# Patient Record
Sex: Female | Born: 1980 | Race: White | Hispanic: No | Marital: Married | Smoking: Never smoker
Health system: Southern US, Community
[De-identification: ages and names within clinical notes are randomized; demographics above are authoritative.]

## PROBLEM LIST (undated history)

## (undated) DIAGNOSIS — F419 Anxiety disorder, unspecified: Secondary | ICD-10-CM

## (undated) DIAGNOSIS — Z85828 Personal history of other malignant neoplasm of skin: Secondary | ICD-10-CM

## (undated) DIAGNOSIS — K141 Geographic tongue: Secondary | ICD-10-CM

## (undated) DIAGNOSIS — F5104 Psychophysiologic insomnia: Secondary | ICD-10-CM

## (undated) DIAGNOSIS — B070 Plantar wart: Secondary | ICD-10-CM

## (undated) DIAGNOSIS — K589 Irritable bowel syndrome without diarrhea: Secondary | ICD-10-CM

## (undated) DIAGNOSIS — Z87442 Personal history of urinary calculi: Secondary | ICD-10-CM

## (undated) DIAGNOSIS — J45909 Unspecified asthma, uncomplicated: Secondary | ICD-10-CM

## (undated) HISTORY — DX: Anxiety disorder, unspecified: F41.9

## (undated) HISTORY — PX: BASAL CELL CARCINOMA EXCISION: SHX1214

## (undated) HISTORY — DX: Psychophysiologic insomnia: F51.04

## (undated) HISTORY — DX: Plantar wart: B07.0

## (undated) HISTORY — DX: Personal history of other malignant neoplasm of skin: Z85.828

## (undated) HISTORY — PX: NO PAST SURGERIES: SHX2092

## (undated) HISTORY — DX: Unspecified asthma, uncomplicated: J45.909

## (undated) HISTORY — DX: Irritable bowel syndrome, unspecified: K58.9

## (undated) HISTORY — DX: Personal history of urinary calculi: Z87.442

## (undated) HISTORY — DX: Geographic tongue: K14.1

## (undated) HISTORY — PX: WISDOM TOOTH EXTRACTION: SHX21

---

## 2011-05-16 ENCOUNTER — Emergency Department
Admission: EM | Admit: 2011-05-16 | Discharge: 2011-05-16 | Disposition: A | Discharge status: Home / Self Care | Payer: BC Managed Care – PPO | Source: Home / Self Care | Attending: Emergency Medicine | Admitting: Emergency Medicine

## 2011-05-16 DIAGNOSIS — J029 Acute pharyngitis, unspecified: Secondary | ICD-10-CM

## 2011-05-16 DIAGNOSIS — J069 Acute upper respiratory infection, unspecified: Secondary | ICD-10-CM

## 2011-05-16 NOTE — ED Provider Notes (Signed)
History     CSN: [id number]  Arrival date & time 05/16/11  1503   First MD Initiated Contact with Patient 05/16/11 1503      No chief complaint on file.   (Consider location/radiation/quality/duration/timing/severity/associated sxs/prior treatment) HPI Dana Robertson is a 31 y.o. female who complains of onset of cold symptoms for 3 days.  She & her husband are trying to get pregnant, so she'd prefer not to take any meds if possible.  + sore throat No cough No pleuritic pain No wheezing + nasal congestion + post-nasal drainage No sinus pain/pressure No chest congestion No itchy/red eyes No earache No hemoptysis No SOB + chills/sweats No fever No nausea No vomiting No abdominal pain No diarrhea No skin rashes + fatigue ? myalgias + headache    No past medical history on file.  No past surgical history on file.  No family history on file.  History  Substance Use Topics  . Smoking status: Not on file  . Smokeless tobacco: Not on file  . Alcohol Use: Not on file    OB History    No data available      Review of Systems  All other systems reviewed and are negative.    Allergies  Review of patient's allergies indicates not on file.  Home Medications  No current outpatient prescriptions on file.  There were no vitals taken for this visit.  Physical Exam  Nursing note and vitals reviewed. Constitutional: She is oriented to person, place, and time. She appears well-developed and well-nourished.  HENT:  Head: Normocephalic and atraumatic.  Right Ear: Tympanic membrane, external ear and ear canal normal.  Left Ear: Tympanic membrane, external ear and ear canal normal.  Nose: Rhinorrhea present.  Mouth/Throat: No oropharyngeal exudate, posterior oropharyngeal edema or tonsillar abscesses.  Eyes: No scleral icterus.  Neck: Neck supple.  Cardiovascular: Regular rhythm and normal heart sounds.   Pulmonary/Chest: Effort normal and breath sounds normal. No  respiratory distress.  Neurological: She is alert and oriented to person, place, and time.  Skin: Skin is warm and dry.  Psychiatric: She has a normal mood and affect. Her speech is normal.    ED Course  Procedures (including critical care time)  Labs Reviewed - No data to display No results found.   No diagnosis found.    MDM  1)  rapid strep is negative. A culture is pending. Because she is currently pregnant, I would prefer that she treated with hydration, nasal saline in no medicines if possible. 2)  Use nasal saline solution (over the counter) at least 3 times a day. 3)  Can take tylenol for pain or fever. 4)  Follow up with your primary doctor if no improvement in 5-7 days, sooner if increasing pain, fever, or new symptoms.     Lily Kocher, MD 05/16/11 [phone]

## 2011-05-16 NOTE — ED Notes (Signed)
Pt c/o sore throat and cold SX onset Thursday.

## 2011-05-18 ENCOUNTER — Telehealth: Payer: Self-pay | Admitting: *Deleted

## 2014-01-07 ENCOUNTER — Emergency Department
Admission: EM | Admit: 2014-01-07 | Discharge: 2014-01-07 | Disposition: A | Discharge status: Home / Self Care | Payer: BC Managed Care – PPO | Source: Home / Self Care | Attending: Emergency Medicine | Admitting: Emergency Medicine

## 2014-01-07 ENCOUNTER — Encounter: Payer: Self-pay | Admitting: Emergency Medicine

## 2014-01-07 DIAGNOSIS — J0100 Acute maxillary sinusitis, unspecified: Secondary | ICD-10-CM

## 2014-01-07 MED ORDER — AMOXICILLIN 875 MG PO TABS: ORAL_TABLET | ORAL | Status: DC

## 2014-01-07 MED ORDER — FLUTICASONE PROPIONATE 50 MCG/ACT NA SUSP: NASAL | Status: DC

## 2014-01-07 NOTE — ED Notes (Signed)
Sx began approx 1 wk ago.  Nasal congestion with green mucus, non productive cough.  Used Vicks Sinus Spray

## 2014-01-07 NOTE — ED Provider Notes (Signed)
CSN: [id number]     Arrival date & time 01/07/14  0920 History   First MD Initiated Contact with Patient 01/07/14 0932     Chief Complaint  Patient presents with  . URI   (Consider location/radiation/quality/duration/timing/severity/associated sxs/prior Treatment) HPI SINUSITIS  Onset: 7 days Facial/sinus pressure with discolored nasal mucus.    Severity: moderate Tried OTC meds without significant relief.  Symptoms:  + Fever  + URI prodrome with nasal congestion + Minimal swollen neck glands + mild Sinus Headache + mild ear pressure  No Allergy symptoms No significant Sore Throat No eye symptoms     No significant Cough No chest pain No shortness of breath  No wheezing  No Abdominal Pain No Nausea No Vomiting No diarrhea  No Myalgias No focal neurologic symptoms No syncope No Rash  No Urinary symptoms          History reviewed. No pertinent past medical history. History reviewed. No pertinent past surgical history. No family history on file. History  Substance Use Topics  . Smoking status: Never Smoker   . Smokeless tobacco: Never Used  . Alcohol Use: Yes   OB History   Grav Para Term Preterm Abortions TAB SAB Ect Mult Living                 Review of Systems  All other systems reviewed and are negative.   Allergies  Codeine  Home Medications   Prior to Admission medications   Medication Sig Start Date End Date Taking? Authorizing Provider  loratadine-pseudoephedrine (CLARITIN-D 24-HOUR) 10-240 MG per 24 hr tablet Take 1 tablet by mouth daily.   Yes Historical Provider, MD  Probiotic Product (PROBIOTIC & ACIDOPHILUS EX ST PO) Take by mouth.   Yes Historical Provider, MD  zolpidem (AMBIEN) 5 MG tablet Take 5 mg by mouth at bedtime as needed for sleep.   Yes Historical Provider, MD  amoxicillin (AMOXIL) 875 MG tablet Take 1 twice a day X 10 days. 01/07/14   Lajean Manesavid Massey, MD  fluticasone Aleda Grana(FLONASE) 50 MCG/ACT nasal spray 1 or 2 sprays each  nostril twice a day 01/07/14   Lajean Manesavid Massey, MD  Multiple Vitamins-Minerals (MULTIVITAMIN PO) Take by mouth.    Historical Provider, MD   BP 109/81  Pulse 81  Temp(Src) 97.9 F (36.6 C) (Oral)  Ht 5' 3.75" (1.619 m)  Wt 118 lb 4 oz (53.638 kg)  BMI 20.46 kg/m2  SpO2 100%  LMP 12/25/2013 Physical Exam  Nursing note and vitals reviewed. Constitutional: She is oriented to person, place, and time. She appears well-developed and well-nourished. No distress.  HENT:  Head: Normocephalic and atraumatic.  Right Ear: Tympanic membrane, external ear and ear canal normal.  Left Ear: Tympanic membrane, external ear and ear canal normal.  Nose: Mucosal edema and rhinorrhea present. Right sinus exhibits maxillary sinus tenderness. Left sinus exhibits maxillary sinus tenderness.  Mouth/Throat: Oropharynx is clear and moist. No oral lesions. No oropharyngeal exudate.  Eyes: Right eye exhibits no discharge. Left eye exhibits no discharge. No scleral icterus.  Neck: Neck supple.  Cardiovascular: Normal rate, regular rhythm and normal heart sounds.   Pulmonary/Chest: Effort normal and breath sounds normal. She has no wheezes. She has no rales.  Lymphadenopathy:    She has no cervical adenopathy.  Neurological: She is alert and oriented to person, place, and time.  Skin: Skin is warm and dry.    ED Course  Procedures (including critical care time) Labs Review Labs Reviewed - No data to  display  Imaging Review No results found.   MDM   1. Acute maxillary sinusitis, recurrence not specified    Treatment options discussed, as well as risks, benefits, alternatives. Patient voiced understanding and agreement with the following plans:   Amoxicillin Flonase Wean off nasal decongestant spray Claritin or Zyrtec, but precautions  to avoid if this over drying  Mucinex D Follow-up with your primary care doctor in 5-7 days if not improving, or sooner if symptoms become worse. Precautions  discussed. Red flags discussed. Questions invited and answered. Patient voiced understanding and agreement.   Lajean Manes, MD 01/07/14 1322

## 2017-06-14 DIAGNOSIS — Z01419 Encounter for gynecological examination (general) (routine) without abnormal findings: Secondary | ICD-10-CM | POA: Diagnosis not present

## 2017-06-14 DIAGNOSIS — Z6820 Body mass index (BMI) 20.0-20.9, adult: Secondary | ICD-10-CM | POA: Diagnosis not present

## 2017-06-29 DIAGNOSIS — J329 Chronic sinusitis, unspecified: Secondary | ICD-10-CM | POA: Diagnosis not present

## 2017-06-29 DIAGNOSIS — J301 Allergic rhinitis due to pollen: Secondary | ICD-10-CM | POA: Diagnosis not present

## 2017-06-29 DIAGNOSIS — B9689 Other specified bacterial agents as the cause of diseases classified elsewhere: Secondary | ICD-10-CM | POA: Diagnosis not present

## 2017-06-29 DIAGNOSIS — R21 Rash and other nonspecific skin eruption: Secondary | ICD-10-CM | POA: Diagnosis not present

## 2017-07-29 DIAGNOSIS — N200 Calculus of kidney: Secondary | ICD-10-CM | POA: Diagnosis not present

## 2017-10-26 DIAGNOSIS — F5101 Primary insomnia: Secondary | ICD-10-CM | POA: Diagnosis not present

## 2018-02-01 DIAGNOSIS — L731 Pseudofolliculitis barbae: Secondary | ICD-10-CM | POA: Diagnosis not present

## 2018-02-01 DIAGNOSIS — D485 Neoplasm of uncertain behavior of skin: Secondary | ICD-10-CM | POA: Diagnosis not present

## 2018-02-01 DIAGNOSIS — C44612 Basal cell carcinoma of skin of right upper limb, including shoulder: Secondary | ICD-10-CM | POA: Diagnosis not present

## 2018-04-26 DIAGNOSIS — Z0000 Encounter for general adult medical examination without abnormal findings: Secondary | ICD-10-CM | POA: Diagnosis not present

## 2018-04-26 DIAGNOSIS — Z13220 Encounter for screening for lipoid disorders: Secondary | ICD-10-CM | POA: Diagnosis not present

## 2018-05-16 ENCOUNTER — Ambulatory Visit: Payer: Self-pay | Admitting: Physician Assistant

## 2018-05-16 ENCOUNTER — Encounter: Payer: Self-pay | Admitting: Physician Assistant

## 2018-05-16 VITALS — BP 113/82 | HR 86 | Ht 64.0 in | Wt 116.0 lb

## 2018-05-16 DIAGNOSIS — Z87442 Personal history of urinary calculi: Secondary | ICD-10-CM | POA: Diagnosis not present

## 2018-05-16 DIAGNOSIS — F5104 Psychophysiologic insomnia: Secondary | ICD-10-CM

## 2018-05-16 DIAGNOSIS — Z7689 Persons encountering health services in other specified circumstances: Secondary | ICD-10-CM

## 2018-05-16 DIAGNOSIS — G43009 Migraine without aura, not intractable, without status migrainosus: Secondary | ICD-10-CM

## 2018-05-16 DIAGNOSIS — K141 Geographic tongue: Secondary | ICD-10-CM | POA: Insufficient documentation

## 2018-05-16 DIAGNOSIS — K581 Irritable bowel syndrome with constipation: Secondary | ICD-10-CM | POA: Diagnosis not present

## 2018-05-16 DIAGNOSIS — Z85828 Personal history of other malignant neoplasm of skin: Secondary | ICD-10-CM

## 2018-05-16 MED ORDER — SUMATRIPTAN SUCCINATE 50 MG PO TABS: 50.0000 mg | ORAL_TABLET | Freq: Once | ORAL | 2 refills | Status: DC | PRN

## 2018-05-16 MED ORDER — ZOLPIDEM TARTRATE 10 MG PO TABS: 5.0000 mg | ORAL_TABLET | Freq: Every evening | ORAL | 2 refills | Status: DC | PRN

## 2018-05-16 NOTE — Progress Notes (Signed)
HPI:                                                                Dana Robertson is a 38 y.o. female who presents to [facility]: Primary Care Sports Medicine today to establish care  Current concerns:  IBS: struggles mostly with constipation and bloating. Takes a capful of Miralax every morning. She exercises regularly. She takes Smooth Move tea approx 1 per week and Mag Citrate every other month. She was evaluated by [facility]. No prior hx of colonoscopy.  Migraines: diagnosed in high school. Reports 1 headache every 2-3 months. Triggers include weather/barometric pressure and menstrual cycle. Typically takes Tylenol and 12-hr Sudafed, which usually works for her. She has tried Imitrex in the past and would like an Rx for this.  Chronic insomnia: has been taking Ambien nightly for the last 6 years. She cannot sleep without it.  Prior medications: Trazodone (ineffective), Lunesta (expensive)  Depression screen [facility] 2/9 05/16/2018  Decreased Interest 0  Down, Depressed, Hopeless 0  PHQ - 2 Score 0    No flowsheet data found.    Past Medical History:  Diagnosis Date  . Anxiety   . Asthma    childhood  . Chronic insomnia   . Geographic tongue   . History of nephrolithiasis   . History of skin cancer   . IBS (irritable bowel syndrome)    Past Surgical History:  Procedure Laterality Date  . BASAL CELL CARCINOMA EXCISION Right    arm  . NO PAST SURGERIES    . WISDOM TOOTH EXTRACTION     Social History   Tobacco Use  . Smoking status: Never Smoker  . Smokeless tobacco: Never Used  Substance Use Topics  . Alcohol use: Yes    Alcohol/week: 1.0 - 2.0 standard drinks    Types: 1 - 2 Standard drinks or equivalent per week   family history includes Breast cancer in her maternal grandmother; Diabetes in her father; Skin cancer in her father.    ROS: Review of Systems  Gastrointestinal: Positive for constipation.  Neurological: Positive for  headaches.  Psychiatric/Behavioral: The patient has insomnia.   All other systems reviewed and are negative.    Medications: Current Outpatient Medications  Medication Sig Dispense Refill  . fluticasone (FLONASE) 50 MCG/ACT nasal spray 1 or 2 sprays each nostril twice a day 16 g 0  . imiquimod (ALDARA) 5 % cream Apply topically 3 (three) times a week.    . loratadine-pseudoephedrine (CLARITIN-D 24-HOUR) 10-240 MG per 24 hr tablet Take 1 tablet by mouth daily.    . Multiple Vitamins-Minerals (MULTIVITAMIN PO) Take by mouth.    . Polyethylene Glycol 3350 (MIRALAX PO) Take by mouth.    . Probiotic Product (PROBIOTIC & ACIDOPHILUS EX ST PO) Take by mouth.    Marland Kitchen Specialty Vitamins Products (VITAMINS FOR HAIR PO) Take by mouth.    . zolpidem (AMBIEN) 10 MG tablet Take 0.5-1 tablets (5-10 mg total) by mouth at bedtime as needed for sleep. 30 tablet 2  . SUMAtriptan (IMITREX) 50 MG tablet Take 1 tablet (50 mg total) by mouth once as needed for migraine. May repeat in 2 hours if headache persists or recurs. 6 tablet 2   No current facility-administered medications  for this visit.    Allergies  Allergen Reactions  . Codeine Nausea Only       Objective:  BP 113/82   Pulse 86   Ht 5\' 4"  (1.626 m)   Wt 116 lb (52.6 kg)   LMP 04/29/2018   BMI 19.91 kg/m  Gen:  alert, not ill-appearing, no distress, appropriate for age HEENT: head normocephalic without obvious abnormality, conjunctiva and cornea clear, wearing glasses, trachea midline Pulm: Normal work of breathing, normal phonation Neuro: alert and oriented x 3, no tremor MSK: extremities atraumatic, normal gait and station Skin: intact, no rashes on exposed skin, no jaundice, no cyanosis Psych: well-groomed, cooperative, good eye contact, euthymic mood, affect mood-congruent, speech is articulate, and thought processes clear and goal-directed   Assessment and Plan: 38 y.o. female with   .Dana Robertson was seen today for establish  care.  Diagnoses and all orders for this visit:  Encounter to establish care  History of nephrolithiasis  Chronic insomnia -     zolpidem (AMBIEN) 10 MG tablet; Take 0.5-1 tablets (5-10 mg total) by mouth at bedtime as needed for sleep.  History of skin cancer Comments: [facility]  Irritable bowel syndrome with constipation  Migraine without aura and without status migrainosus, not intractable -     SUMAtriptan (IMITREX) 50 MG tablet; Take 1 tablet (50 mg total) by mouth once as needed for migraine. May repeat in 2 hours if headache persists or recurs.   - Personally reviewed PMH, PSH, PFH, medications, allergies, HM - Personally reviewed labs in Care Everywhere from 04/2018 showing normal CMP and CBC - Age-appropriate cancer screening: Pap UTD, requesting record from [facility] - Influenza UTD per patient - Tdap UTD per patient - PHQ2 negative    Patient education and anticipatory guidance given Patient agrees with treatment plan Follow-up in 6 months for CPE or sooner as needed if symptoms worsen or fail to improve  Levonne Hubert PA-C

## 2018-05-25 DIAGNOSIS — C44612 Basal cell carcinoma of skin of right upper limb, including shoulder: Secondary | ICD-10-CM | POA: Diagnosis not present

## 2018-06-17 DIAGNOSIS — S161XXA Strain of muscle, fascia and tendon at neck level, initial encounter: Secondary | ICD-10-CM | POA: Diagnosis not present

## 2018-06-17 DIAGNOSIS — M542 Cervicalgia: Secondary | ICD-10-CM | POA: Diagnosis not present

## 2018-06-19 ENCOUNTER — Telehealth: Payer: Self-pay | Admitting: Family Medicine

## 2018-06-19 MED ORDER — METAXALONE 400 MG PO TABS: 400.0000 mg | ORAL_TABLET | Freq: Three times a day (TID) | ORAL | 0 refills | Status: DC

## 2018-06-19 NOTE — Telephone Encounter (Signed)
Patiet called this evening. Went to [employer] this week for neck injury. Fell from swing. Reports xrays were negative. Taking Motrin and icing. Still having significant pain.  Will call I muscle relaxer. Will f/u with PCP at end of week if not improving. Work on gentle stretches.

## 2018-06-27 ENCOUNTER — Ambulatory Visit: Payer: BLUE CROSS/BLUE SHIELD | Admitting: Physician Assistant

## 2018-09-30 ENCOUNTER — Telehealth: Payer: Self-pay | Admitting: Family Medicine

## 2018-09-30 DIAGNOSIS — G43009 Migraine without aura, not intractable, without status migrainosus: Secondary | ICD-10-CM

## 2018-09-30 MED ORDER — SUMATRIPTAN SUCCINATE 50 MG PO TABS: 50.0000 mg | ORAL_TABLET | Freq: Every day | ORAL | 2 refills | Status: DC | PRN

## 2018-09-30 NOTE — Telephone Encounter (Signed)
Pt called and needs refill in imitrex. Leaving town [employer].  Had more migrdaines this month and used last one.  Will [facility] ot [facility].

## 2018-10-20 ENCOUNTER — Encounter: Payer: Self-pay | Admitting: Physician Assistant

## 2018-10-20 ENCOUNTER — Telehealth (INDEPENDENT_AMBULATORY_CARE_PROVIDER_SITE_OTHER): Payer: BC Managed Care – PPO | Admitting: Physician Assistant

## 2018-10-20 DIAGNOSIS — R0982 Postnasal drip: Secondary | ICD-10-CM

## 2018-10-20 DIAGNOSIS — G43009 Migraine without aura, not intractable, without status migrainosus: Secondary | ICD-10-CM | POA: Diagnosis not present

## 2018-10-20 DIAGNOSIS — J309 Allergic rhinitis, unspecified: Secondary | ICD-10-CM | POA: Insufficient documentation

## 2018-10-20 DIAGNOSIS — Z79899 Other long term (current) drug therapy: Secondary | ICD-10-CM | POA: Diagnosis not present

## 2018-10-20 DIAGNOSIS — F411 Generalized anxiety disorder: Secondary | ICD-10-CM | POA: Insufficient documentation

## 2018-10-20 DIAGNOSIS — F5104 Psychophysiologic insomnia: Secondary | ICD-10-CM | POA: Diagnosis not present

## 2018-10-20 MED ORDER — ZOLPIDEM TARTRATE 10 MG PO TABS: 5.0000 mg | ORAL_TABLET | Freq: Every evening | ORAL | 5 refills | Status: DC | PRN

## 2018-10-20 NOTE — Progress Notes (Signed)
Virtual Visit via Video Note  I connected with Dana Robertson on 10/20/18 at 10:10 AM EDT by a video enabled telemedicine application and verified that I am speaking with the correct person using two identifiers.   I discussed the limitations of evaluation and management by telemedicine and the availability of in person appointments. The patient expressed understanding and agreed to proceed.  History of Present Illness: HPI:                                                                Dana Robertson is a 38 y.o. female   CC: medication management  Chronic insomnia: associated with anxiety. Reports that the week before her period she typically has more sleep difficulty and will take 10 mg of Ambien. Most other nights she takes 5 mg. She states anxiety has been increased and she attributes this to change in her regular daily routine. She feels she is supported and is adapting to the changes the pandemic has caused.  Menses: has noticed menses have been more irregular the last few months, coming several days early or late. Periods overall are lighter in the last 3-4 years. Denies menorrhagia or dysmenorrhea.  Migraines: has noticed increased headache frequency over the last few months (1-2 headache days per month), which she attributes to stress and change in routine. Headaches still respond to Imitrex.   Allergic rhinitis: states she has had increased pnd, mucus and throat clearing past her usual spring allergy season. She attributed this to using herbal supplement Ashweganda. She is taking Claritin-D without much relief. Not taking her Flonase just to limit number of medications she is taking.   IBS: well managed with diet, Miralax, probiotic and Mag Citrate prn. She has reduced lactose/dairy, gluten and processed sugar.   [facility] 10/20/2018 05/16/2018  Decreased Interest 0 0  Down, Depressed, Hopeless 1 0  PHQ - 2 Score 1 0  Altered sleeping 2 -  Tired, decreased  energy 0 -  Change in appetite 0 -  Feeling bad or failure about yourself  0 -  Trouble concentrating 0 -  Moving slowly or fidgety/restless 0 -  Suicidal thoughts 0 -  PHQ-9 Score 3 -    GAD 7 : Generalized Anxiety Score 10/20/2018  Nervous, Anxious, on Edge 1  Control/stop worrying 0  Worry too much - different things 1  Trouble relaxing 1  Restless 0  Easily annoyed or irritable 1  Afraid - awful might happen 0  Total GAD 7 Score 4      Past Medical History:  Diagnosis Date  . Anxiety   . Asthma    childhood  . Chronic insomnia   . Geographic tongue   . History of nephrolithiasis   . History of skin cancer   . IBS (irritable bowel syndrome)    Past Surgical History:  Procedure Laterality Date  . BASAL CELL CARCINOMA EXCISION Right    arm  . NO PAST SURGERIES    . WISDOM TOOTH EXTRACTION     Social History   Tobacco Use  . Smoking status: Never Smoker  . Smokeless tobacco: Never Used  Substance Use Topics  . Alcohol use: Yes    Alcohol/week: 1.0 - 2.0 standard drinks    Types: 1 -  2 Standard drinks or equivalent per week   family history includes Breast cancer in her maternal grandmother; Diabetes in her father; Skin cancer in her father.    ROS: negative except as noted in the HPI  Medications: Current Outpatient Medications  Medication Sig Dispense Refill  . imiquimod (ALDARA) 5 % cream Apply topically 3 (three) times a week.    . loratadine-pseudoephedrine (CLARITIN-D 24-HOUR) 10-240 MG per 24 hr tablet Take 1 tablet by mouth daily.    . Multiple Vitamins-Minerals (MULTIVITAMIN PO) Take by mouth.    . Polyethylene Glycol 3350 (MIRALAX PO) Take by mouth.    . Probiotic Product (PROBIOTIC & ACIDOPHILUS EX ST PO) Take by mouth.    Marland Kitchen. Specialty Vitamins Products (VITAMINS FOR HAIR PO) Take by mouth.    . SUMAtriptan (IMITREX) 50 MG tablet Take 1 tablet (50 mg total) by mouth daily as needed for migraine. May repeat in 2 hours if headache persists or  recurs. 10 tablet 2  . zolpidem (AMBIEN) 10 MG tablet Take 0.5-1 tablets (5-10 mg total) by mouth at bedtime as needed for sleep. 60 tablet 5   No current facility-administered medications for this visit.    Allergies  Allergen Reactions  . Codeine Nausea Only       Objective:  LMP 10/16/2018 (Exact Date)   Wt Readings from Last 3 Encounters:  05/16/18 116 lb (52.6 kg)  01/07/14 118 lb 4 oz (53.6 kg)  05/16/11 115 lb (52.2 kg)   Temp Readings from Last 3 Encounters:  01/07/14 97.9 F (36.6 C) (Oral)  05/16/11 99 F (37.2 C) (Oral)   BP Readings from Last 3 Encounters:  05/16/18 113/82  01/07/14 109/81  05/16/11 114/79   Pulse Readings from Last 3 Encounters:  05/16/18 86  01/07/14 81  05/16/11 85    Gen:  alert, not ill-appearing, no distress, appropriate for age HEENT: head normocephalic without obvious abnormality, conjunctiva and cornea clear, trachea midline Pulm: Normal work of breathing, normal phonation Neuro: alert and oriented x 3 Psych: cooperative, euthymic mood, affect mood-congruent, speech is articulate, normal rate and volume; thought processes clear and goal-directed, normal judgment, good insight  No results found for: CREATININE, BUN, NA, K, CL, CO2 No results found for: ALT, AST, GGT, ALKPHOS, BILITOT   Assessment and Plan: 38 y.o. female with   .Diagnoses and all orders for this visit:  Encounter for long-term (current) use of medications  Chronic insomnia -     zolpidem (AMBIEN) 10 MG tablet; Take 0.5-1 tablets (5-10 mg total) by mouth at bedtime as needed for sleep.  Migraine without aura and without status migrainosus, not intractable  Generalized anxiety disorder  Allergic rhinitis with postnasal drip  GAD, Chronic insomnia PHQ9=3, GAD7=4 Declines medication [facility]. Managing symptoms on her own with exercise and healthy coping skills Ambien refilled. She is dependent on this medication for sleep. Does  not wish to taper at this time  Migraines Cont prn Sumtriptan 50 mg  Allergic rhinitis Recommended trial of different antihistamine, nasal saline irrigation or re-start Flonase  Follow-up in 6 months or sooner as needed if symptoms worsen or fail to improve  Follow Up Instructions:    I discussed the assessment and treatment plan with the patient. The patient was provided an opportunity to ask questions and all were answered. The patient agreed with the plan and demonstrated an understanding of the instructions.   The patient was advised to call back or seek an in-person  evaluation if the symptoms worsen or if the condition fails to improve as anticipated.  I provided 10 minutes of non-face-to-face time during this encounter.   Trixie Dredge, [place]

## 2018-10-30 ENCOUNTER — Telehealth: Payer: BLUE CROSS/BLUE SHIELD | Admitting: Physician Assistant

## 2018-11-14 ENCOUNTER — Ambulatory Visit: Payer: Self-pay | Admitting: Physician Assistant

## 2018-11-28 ENCOUNTER — Telehealth: Payer: Self-pay | Admitting: Family Medicine

## 2018-11-28 MED ORDER — HYDROCODONE-ACETAMINOPHEN 5-325 MG PO TABS: 1.0000 | ORAL_TABLET | Freq: Four times a day (QID) | ORAL | 0 refills | Status: DC | PRN

## 2018-11-28 MED ORDER — TAMSULOSIN HCL 0.4 MG PO CAPS: 0.4000 mg | ORAL_CAPSULE | Freq: Every day | ORAL | 0 refills | Status: DC

## 2018-11-28 NOTE — Telephone Encounter (Signed)
Pt called after hours. Reports history of kidney stones.  Started having back pain in spasms this afternoon with rust colored urine around 4:30. Has tried IBU 800mg  for pain relief.  Still having pain.  Significant nausea.  No fever. Some chills from pain. No dysuria.  12 tab hydrocodone sent to pharmacy and flomax. Call PCP in next day or two if not improving or passing stone.

## 2018-11-29 DIAGNOSIS — Z87442 Personal history of urinary calculi: Secondary | ICD-10-CM | POA: Diagnosis not present

## 2018-11-29 DIAGNOSIS — R1030 Lower abdominal pain, unspecified: Secondary | ICD-10-CM | POA: Diagnosis not present

## 2018-11-29 DIAGNOSIS — N2882 Megaloureter: Secondary | ICD-10-CM | POA: Diagnosis not present

## 2018-11-29 DIAGNOSIS — N132 Hydronephrosis with renal and ureteral calculous obstruction: Secondary | ICD-10-CM | POA: Diagnosis not present

## 2018-11-29 DIAGNOSIS — R1032 Left lower quadrant pain: Secondary | ICD-10-CM | POA: Diagnosis not present

## 2018-11-30 DIAGNOSIS — N202 Calculus of kidney with calculus of ureter: Secondary | ICD-10-CM | POA: Diagnosis not present

## 2018-11-30 DIAGNOSIS — K5900 Constipation, unspecified: Secondary | ICD-10-CM | POA: Diagnosis not present

## 2018-11-30 DIAGNOSIS — N201 Calculus of ureter: Secondary | ICD-10-CM | POA: Diagnosis not present

## 2018-12-01 DIAGNOSIS — N201 Calculus of ureter: Secondary | ICD-10-CM | POA: Diagnosis not present

## 2018-12-05 ENCOUNTER — Other Ambulatory Visit: Payer: Self-pay | Admitting: Family Medicine

## 2018-12-20 ENCOUNTER — Other Ambulatory Visit: Payer: Self-pay | Admitting: Physician Assistant

## 2019-01-02 DIAGNOSIS — F4310 Post-traumatic stress disorder, unspecified: Secondary | ICD-10-CM | POA: Diagnosis not present

## 2019-01-18 DIAGNOSIS — F4310 Post-traumatic stress disorder, unspecified: Secondary | ICD-10-CM | POA: Diagnosis not present

## 2019-01-29 DIAGNOSIS — F4310 Post-traumatic stress disorder, unspecified: Secondary | ICD-10-CM | POA: Diagnosis not present

## 2019-02-15 DIAGNOSIS — F4310 Post-traumatic stress disorder, unspecified: Secondary | ICD-10-CM | POA: Diagnosis not present

## 2019-03-12 DIAGNOSIS — F4312 Post-traumatic stress disorder, chronic: Secondary | ICD-10-CM | POA: Diagnosis not present

## 2019-04-12 DIAGNOSIS — F4312 Post-traumatic stress disorder, chronic: Secondary | ICD-10-CM | POA: Diagnosis not present

## 2019-05-02 ENCOUNTER — Other Ambulatory Visit: Payer: Self-pay | Admitting: Family Medicine

## 2019-05-02 DIAGNOSIS — G43009 Migraine without aura, not intractable, without status migrainosus: Secondary | ICD-10-CM

## 2019-05-10 DIAGNOSIS — F3341 Major depressive disorder, recurrent, in partial remission: Secondary | ICD-10-CM | POA: Diagnosis not present

## 2019-06-07 ENCOUNTER — Other Ambulatory Visit: Payer: Self-pay | Admitting: Physician Assistant

## 2019-06-07 DIAGNOSIS — F5104 Psychophysiologic insomnia: Secondary | ICD-10-CM

## 2019-06-08 ENCOUNTER — Ambulatory Visit: Payer: BC Managed Care – PPO | Admitting: Medical-Surgical

## 2019-06-11 ENCOUNTER — Other Ambulatory Visit: Payer: Self-pay

## 2019-06-11 ENCOUNTER — Encounter: Payer: Self-pay | Admitting: Medical-Surgical

## 2019-06-11 ENCOUNTER — Ambulatory Visit (INDEPENDENT_AMBULATORY_CARE_PROVIDER_SITE_OTHER): Payer: BC Managed Care – PPO | Admitting: Medical-Surgical

## 2019-06-11 VITALS — BP 118/78 | HR 91 | Temp 98.1°F | Ht 64.0 in | Wt 122.0 lb

## 2019-06-11 DIAGNOSIS — G43009 Migraine without aura, not intractable, without status migrainosus: Secondary | ICD-10-CM | POA: Diagnosis not present

## 2019-06-11 DIAGNOSIS — F5104 Psychophysiologic insomnia: Secondary | ICD-10-CM | POA: Diagnosis not present

## 2019-06-11 DIAGNOSIS — K581 Irritable bowel syndrome with constipation: Secondary | ICD-10-CM

## 2019-06-11 MED ORDER — LINACLOTIDE 290 MCG PO CAPS: 290.0000 ug | ORAL_CAPSULE | Freq: Every day | ORAL | 3 refills | Status: DC

## 2019-06-11 MED ORDER — SIMETHICONE 180 MG PO CAPS: 1.0000 | ORAL_CAPSULE | Freq: Four times a day (QID) | ORAL | 0 refills | Status: DC | PRN

## 2019-06-11 MED ORDER — SUMATRIPTAN SUCCINATE 50 MG PO TABS: 50.0000 mg | ORAL_TABLET | Freq: Every day | ORAL | 2 refills | Status: DC | PRN

## 2019-06-11 MED ORDER — ZOLPIDEM TARTRATE 10 MG PO TABS: 5.0000 mg | ORAL_TABLET | Freq: Every evening | ORAL | 3 refills | Status: DC | PRN

## 2019-06-11 NOTE — Assessment & Plan Note (Signed)
Discussed options for management of symptoms.  Will trial Linzess 290 mcg daily along with simethicone 4 times daily as needed.  If this is unsuccessful, may need to revisit GI evaluation.

## 2019-06-11 NOTE — Assessment & Plan Note (Signed)
Continue Ambien 1/2-1 tab nightly as needed for sleep.

## 2019-06-11 NOTE — Patient Instructions (Signed)
Low-FODMAP Eating Plan  FODMAPs (fermentable oligosaccharides, disaccharides, monosaccharides, and polyols) are sugars that are hard for some people to digest. A low-FODMAP eating plan may help some people who have bowel (intestinal) diseases to manage their symptoms. This meal plan can be complicated to follow. Work with a diet and nutrition specialist (dietitian) to make a low-FODMAP eating plan that is right for you. A dietitian can make sure that you get enough nutrition from this diet. What are tips for following this plan? Reading food labels  Check labels for hidden FODMAPs such as: ? High-fructose syrup. ? Honey. ? Agave. ? Natural fruit flavors. ? Onion or garlic powder.  Choose low-FODMAP foods that contain 3-4 grams of fiber per serving.  Check food labels for serving sizes. Eat only one serving at a time to make sure FODMAP levels stay low. Meal planning  Follow a low-FODMAP eating plan for up to 6 weeks, or as told by your health care provider or dietitian.  To follow the eating plan: 1. Eliminate high-FODMAP foods from your diet completely. 2. Gradually reintroduce high-FODMAP foods into your diet one at a time. Most people should wait a few days after introducing one high-FODMAP food before they introduce the next high-FODMAP food. Your dietitian can recommend how quickly you may reintroduce foods. 3. Keep a daily record of what you eat and drink, and make note of any symptoms that you have after eating. 4. Review your daily record with a dietitian regularly. Your dietitian can help you identify which foods you can eat and which foods you should avoid. General tips  Drink enough fluid each day to keep your urine pale yellow.  Avoid processed foods. These often have added sugar and may be high in FODMAPs.  Avoid most dairy products, whole grains, and sweeteners.  Work with a dietitian to make sure you get enough fiber in your diet. Recommended  foods Grains  Gluten-free grains, such as rice, oats, buckwheat, quinoa, corn, polenta, and millet. Gluten-free pasta, bread, or cereal. Rice noodles. Corn tortillas. Vegetables  Eggplant, zucchini, cucumber, peppers, green beans, Brussels sprouts, bean sprouts, lettuce, arugula, kale, Swiss chard, spinach, collard greens, bok choy, summer squash, potato, and tomato. Limited amounts of corn, carrot, and sweet potato. Green parts of scallions. Fruits  Bananas, oranges, lemons, limes, blueberries, raspberries, strawberries, grapes, cantaloupe, honeydew melon, kiwi, papaya, passion fruit, and pineapple. Limited amounts of dried cranberries, banana chips, and shredded coconut. Dairy  Lactose-free milk, yogurt, and kefir. Lactose-free cottage cheese and ice cream. Non-dairy milks, such as almond, coconut, hemp, and rice milk. Yogurts made of non-dairy milks. Limited amounts of goat cheese, brie, mozzarella, parmesan, swiss, and other hard cheeses. Meats and other protein foods  Unseasoned beef, pork, poultry, or fish. Eggs. Bacon. Tofu (firm) and tempeh. Limited amounts of nuts and seeds, such as almonds, walnuts, brazil nuts, pecans, peanuts, pumpkin seeds, chia seeds, and sunflower seeds. Fats and oils  Butter-free spreads. Vegetable oils, such as olive, canola, and sunflower oil. Seasoning and other foods  Artificial sweeteners with names that do not end in "ol" such as aspartame, saccharine, and stevia. Maple syrup, white table sugar, raw sugar, brown sugar, and molasses. Fresh basil, coriander, parsley, rosemary, and thyme. Beverages  Water and mineral water. Sugar-sweetened soft drinks. Small amounts of orange juice or cranberry juice. Black and green tea. Most dry wines. Coffee. This may not be a complete list of low-FODMAP foods. Talk with your dietitian for more information. Foods to avoid Grains  Wheat,   including kamut, durum, and semolina. Barley and bulgur. Couscous. Wheat-based  cereals. Wheat noodles, bread, crackers, and pastries. Vegetables  Chicory root, artichoke, asparagus, cabbage, snow peas, sugar snap peas, mushrooms, and cauliflower. Onions, garlic, leeks, and the white part of scallions. Fruits  Fresh, dried, and juiced forms of apple, pear, watermelon, peach, plum, cherries, apricots, blackberries, boysenberries, figs, nectarines, and mango. Avocado. Dairy  Milk, yogurt, ice cream, and soft cheese. Cream and sour cream. Milk-based sauces. Custard. Meats and other protein foods  Fried or fatty meat. Sausage. Cashews and pistachios. Soybeans, baked beans, black beans, chickpeas, kidney beans, fava beans, navy beans, lentils, and split peas. Seasoning and other foods  Any sugar-free gum or candy. Foods that contain artificial sweeteners such as sorbitol, mannitol, isomalt, or xylitol. Foods that contain honey, high-fructose corn syrup, or agave. Bouillon, vegetable stock, beef stock, and chicken stock. Garlic and onion powder. Condiments made with onion, such as hummus, chutney, pickles, relish, salad dressing, and salsa. Tomato paste. Beverages  Chicory-based drinks. Coffee substitutes. Chamomile tea. Fennel tea. Sweet or fortified wines such as port or sherry. Diet soft drinks made with isomalt, mannitol, maltitol, sorbitol, or xylitol. Apple, pear, and mango juice. Juices with high-fructose corn syrup. This may not be a complete list of high-FODMAP foods. Talk with your dietitian to discuss what dietary choices are best for you.  Summary  A low-FODMAP eating plan is a short-term diet that eliminates FODMAPs from your diet to help ease symptoms of certain bowel diseases.  The eating plan usually lasts up to 6 weeks. After that, high-FODMAP foods are restarted gradually, one at a time, so you can find out which may be causing symptoms.  A low-FODMAP eating plan can be complicated. It is best to work with a dietitian who has experience with this type of  plan. This information is not intended to replace advice given to you by your health care provider. Make sure you discuss any questions you have with your health care provider. Document Revised: 03/04/2017 Document Reviewed: 11/16/2016 Elsevier Patient Education  2020 [employer] Inc[employer]  

## 2019-06-11 NOTE — Progress Notes (Signed)
Subjective:    CC: Insomnia, GI concerns, migraines  HPI: Pleasant 39 year old female presenting today to discuss insomnia, migraines, and GI concerns.  Insomnia-long prior history of insomnia with difficulty falling asleep but able to stay asleep without problems.  Taking Ambien one half tab approximately 2-1/2 to 3 weeks out of every month to help with sleep.  Feels this is very effective and would like to continue.  Migraines-approximately 1-2 headache days per month on average with increased to 3-4 in the spring and fall.  Migraines vary depending upon the weather and hormonal factors.  Imitrex is very helpful with usually only 1 dose needed to resolve migraine symptoms.  IBS with constipation-taking MiraLAX 1 capful daily.  Also using mag citrate every 2 to 3 months.  She has taken many over-the-counter fiber supplements, increased fluids, and increased exercise.  Was evaluated by GI 4 years ago but has not seen them since.  She has been tested twice with no food allergies identified however she did do an [place] well test that said she has mild to moderate sensitivity to dairy foods.  Biggest concern is continuous bloating and gassiness that causes significant discomfort.  Bowel movements are daily and soft.  Using simethicone twice daily as needed without much relief.  I reviewed the past medical history, family history, social history, surgical history, and allergies today and no changes were needed.  Please see the problem list section below in epic for further details.  Past Medical History: Past Medical History:  Diagnosis Date  . Anxiety   . Asthma    childhood  . Chronic insomnia   . Geographic tongue   . History of nephrolithiasis   . History of skin cancer   . IBS (irritable bowel syndrome)    Past Surgical History: Past Surgical History:  Procedure Laterality Date  . BASAL CELL CARCINOMA EXCISION Right    arm  . NO PAST SURGERIES    . WISDOM TOOTH EXTRACTION      Social History: Social History   Socioeconomic History  . Marital status: Married    Spouse name: Not on file  . Number of children: Not on file  . Years of education: Not on file  . Highest education level: Not on file  Occupational History  . Not on file  Tobacco Use  . Smoking status: Never Smoker  . Smokeless tobacco: Never Used  Substance and Sexual Activity  . Alcohol use: Yes    Alcohol/week: 1.0 - 2.0 standard drinks    Types: 1 - 2 Standard drinks or equivalent per week  . Drug use: Not Currently    Types: Marijuana  . Sexual activity: Yes    Birth control/protection: Other-see comments    Comment: vasectomy  Other Topics Concern  . Not on file  Social History Narrative  . Not on file   Social Determinants of Health   Financial Resource Strain:   . Difficulty of Paying Living Expenses: Not on file  Food Insecurity:   . Worried About Charity fundraiser in the Last Year: Not on file  . Ran Out of Food in the Last Year: Not on file  Transportation Needs:   . Lack of Transportation (Medical): Not on file  . Lack of Transportation (Non-Medical): Not on file  Physical Activity:   . Days of Exercise per Week: Not on file  . Minutes of Exercise per Session: Not on file  Stress:   . Feeling of Stress : Not on file  Social Connections:   . Frequency of Communication with Friends and Family: Not on file  . Frequency of Social Gatherings with Friends and Family: Not on file  . Attends Religious Services: Not on file  . Active Member of Clubs or Organizations: Not on file  . Attends Banker Meetings: Not on file  . Marital Status: Not on file   Family History: Family History  Problem Relation Age of Onset  . Diabetes Father   . Skin cancer Father   . Breast cancer Maternal Grandmother    Allergies: Allergies  Allergen Reactions  . Codeine Nausea Only   Medications: See med rec.  Review of Systems: No fevers, chills, night sweats, weight  loss, chest pain, or shortness of breath.   Objective:    General: Well Developed, well nourished, and in no acute distress.  Neuro: Alert and oriented x3.  HEENT: Normocephalic, atraumatic.  Skin: Warm and dry. Cardiac: Regular rate and rhythm, no murmurs rubs or gallops, no lower extremity edema.  Respiratory: Clear to auscultation bilaterally. Not using accessory muscles, speaking in full sentences.   Impression and Recommendations:    Migraine headache without aura Continue sumatriptan as needed.  Refills provided.  Irritable bowel syndrome with constipation Discussed options for management of symptoms.  Will trial Linzess 290 mcg daily along with simethicone 4 times daily as needed.  If this is unsuccessful, may need to revisit GI evaluation.  Chronic insomnia Continue Ambien 1/2-1 tab nightly as needed for sleep.  Return in about 6 months (around 12/12/2019) for annual physical exam with labs.  ___________________________________________ Thayer Ohm, DNP, APRN, FNP-BC Primary Care and Sports Medicine [facility] [place]

## 2019-06-11 NOTE — Assessment & Plan Note (Signed)
Continue sumatriptan as needed.  Refills provided.

## 2019-06-15 ENCOUNTER — Telehealth: Payer: Self-pay | Admitting: Physician Assistant

## 2019-06-15 NOTE — Telephone Encounter (Signed)
Received PA on Linzess sent through cover my meds waiting on determination. - CF

## 2019-06-18 MED ORDER — TRULANCE 3 MG PO TABS: 3.0000 mg | ORAL_TABLET | Freq: Every day | ORAL | 2 refills | Status: DC

## 2019-06-18 NOTE — Addendum Note (Signed)
Addended byChristen Butter on: 06/18/2019 03:10 PM   Modules accepted: Orders

## 2019-06-18 NOTE — Telephone Encounter (Signed)
Received fax from [facility] they denied coverage on Linzess due to patient must try and fail [employer] first. Placing in providers box for review. - CF

## 2019-07-02 ENCOUNTER — Telehealth: Payer: Self-pay | Admitting: Medical-Surgical

## 2019-07-02 NOTE — Telephone Encounter (Signed)
Approved today ([employer]) Effective from 07/02/2019 through 06/30/2020. Pharmacy aware.

## 2019-11-09 ENCOUNTER — Ambulatory Visit (INDEPENDENT_AMBULATORY_CARE_PROVIDER_SITE_OTHER): Payer: 59 | Admitting: Family Medicine

## 2019-11-09 ENCOUNTER — Other Ambulatory Visit: Payer: Self-pay | Admitting: Physician Assistant

## 2019-11-09 VITALS — BP 110/80 | HR 112 | Temp 98.0°F | Wt 122.0 lb

## 2019-11-09 DIAGNOSIS — R310 Gross hematuria: Secondary | ICD-10-CM

## 2019-11-09 DIAGNOSIS — N200 Calculus of kidney: Secondary | ICD-10-CM

## 2019-11-09 DIAGNOSIS — M545 Low back pain, unspecified: Secondary | ICD-10-CM

## 2019-11-09 DIAGNOSIS — G43009 Migraine without aura, not intractable, without status migrainosus: Secondary | ICD-10-CM

## 2019-11-09 LAB — POCT URINALYSIS DIP (CLINITEK)
Glucose, UA: NEGATIVE mg/dL
Leukocytes, UA: NEGATIVE
Nitrite, UA: NEGATIVE
POC PROTEIN,UA: 30 — AB
Spec Grav, UA: >=1.03 — AB (ref 1.010–1.025)
Urobilinogen, UA: 0.2 E.U./dL
pH, UA: 6 (ref 5.0–8.0)

## 2019-11-09 NOTE — Progress Notes (Signed)
Patient reports bladder pressure and low back pain.  Feels like it is likely a kidney stone stuck in the bladder.  She has had multiple kidney stones before.  For at least the last 3 days she has not felt well.  Tuesday in particular about 3 days ago, she had excruciating back spasms and a low-grade fever to 100.2 that night.  She has not had a fever since then.  She was concerned that she might have a UTI.  Dipstick negative for infection though she does have protein blood and ketones that she is also family showing up her menstrual cycle.  Culture sent.  If negative for infection and discomfort continues then recommend referral back to urology.  Nani Gasser, MD

## 2019-11-09 NOTE — Progress Notes (Signed)
Patient is here for kidney stone symptoms. Patient reports taking OTC tylenol and sudafed and Zofran  for symptom relief. With no recent use of craterization or antibiotics. Patient has not taken AZO. Patient reports mild low back pain.  U/A and urine culture order placed and pending.

## 2019-11-10 LAB — URINE CULTURE
MICRO NUMBER:: 10797085
SPECIMEN QUALITY:: ADEQUATE

## 2019-11-12 ENCOUNTER — Encounter: Payer: Self-pay | Admitting: Family Medicine

## 2019-11-13 NOTE — Telephone Encounter (Signed)
Appointment has been made for [place]. No further questions at this time.

## 2019-11-14 ENCOUNTER — Ambulatory Visit (INDEPENDENT_AMBULATORY_CARE_PROVIDER_SITE_OTHER): Payer: Self-pay | Admitting: Medical-Surgical

## 2019-11-14 VITALS — BP 98/69 | HR 94

## 2019-11-14 DIAGNOSIS — N200 Calculus of kidney: Secondary | ICD-10-CM

## 2019-11-14 DIAGNOSIS — M545 Low back pain, unspecified: Secondary | ICD-10-CM

## 2019-11-14 NOTE — Progress Notes (Signed)
Pt here to give another urine sample since her last sample showed a growth of mixed flora that suggested possible contamination.   Sample obtained and immediately transferred into culture transfer tube.   Microscopic obtained also but not ordered if needed and placed in bag.

## 2019-11-16 LAB — URINE CULTURE REFLEX: Urine Culture, Comprehensive: NO GROWTH

## 2019-11-20 ENCOUNTER — Telehealth: Payer: Self-pay | Admitting: Family Medicine

## 2019-11-20 NOTE — Telephone Encounter (Signed)
Urine culture result not back.should have been on Friday.  Please call for results.

## 2019-11-21 NOTE — Telephone Encounter (Signed)
[facility] looking into this. Will let provider know update when available

## 2019-11-21 NOTE — Telephone Encounter (Signed)
Patient advised of negative results. States that for most part she is feeling fine but occasional dull but noticeable pain in lower right quadrant. She feels she is ok but this one is lasting longer than ones in the past.   States there is nothing she needs from [place] at this time, she will continue to drink water and if any new or worsening SX she will call us or urologist.

## 2019-11-30 ENCOUNTER — Other Ambulatory Visit (HOSPITAL_COMMUNITY)
Admission: RE | Admit: 2019-11-30 | Discharge: 2019-11-30 | Disposition: A | Discharge status: Home / Self Care | Payer: 59 | Source: Ambulatory Visit | Attending: Medical-Surgical | Admitting: Medical-Surgical

## 2019-11-30 ENCOUNTER — Ambulatory Visit (INDEPENDENT_AMBULATORY_CARE_PROVIDER_SITE_OTHER): Payer: 59 | Admitting: Medical-Surgical

## 2019-11-30 ENCOUNTER — Encounter: Payer: Self-pay | Admitting: Medical-Surgical

## 2019-11-30 ENCOUNTER — Other Ambulatory Visit: Payer: Self-pay

## 2019-11-30 VITALS — BP 111/81 | HR 86 | Temp 98.2°F | Ht 64.0 in | Wt 108.3 lb

## 2019-11-30 DIAGNOSIS — Z23 Encounter for immunization: Secondary | ICD-10-CM

## 2019-11-30 DIAGNOSIS — Z0000 Encounter for general adult medical examination without abnormal findings: Secondary | ICD-10-CM

## 2019-11-30 DIAGNOSIS — Z1159 Encounter for screening for other viral diseases: Secondary | ICD-10-CM

## 2019-11-30 DIAGNOSIS — Z124 Encounter for screening for malignant neoplasm of cervix: Secondary | ICD-10-CM | POA: Diagnosis present

## 2019-11-30 DIAGNOSIS — Z1231 Encounter for screening mammogram for malignant neoplasm of breast: Secondary | ICD-10-CM

## 2019-11-30 DIAGNOSIS — Z1329 Encounter for screening for other suspected endocrine disorder: Secondary | ICD-10-CM | POA: Diagnosis not present

## 2019-11-30 DIAGNOSIS — Z114 Encounter for screening for human immunodeficiency virus [HIV]: Secondary | ICD-10-CM

## 2019-11-30 NOTE — Progress Notes (Signed)
HPI: Dana Robertson is a 39 y.o. female who  has a past medical history of Anxiety, Asthma, Chronic insomnia, Geographic tongue, History of nephrolithiasis, History of skin cancer, IBS (irritable bowel syndrome), and Plantar wart.  she presents to [facility] today, 11/30/19,  for chief complaint of: Annual physical exam  Dentist: twice a year, COVID caused some delay, crown appointment upcoming Eye exam: done on Monday, glasses for driving/watching TV Exercise: was previously very active but not over the summer, walks dog a mile every day Diet: typically clean eater but not doing as well lately; not a big water drinker but trying to stay mindful; avoids soda; limit alcohol; poor appetite with stress  Significant family stress at home stemming from marital issues.  Recent stress resulting in weight loss from approximately 122 pounds to 108 pounds. Seeing a therapist.  Does not feel that she needs a medication to help with anxiety at this time.  Past medical, surgical, social and family history reviewed:  Patient Active Problem List   Diagnosis Date Noted  . Generalized anxiety disorder 10/20/2018  . Allergic rhinitis with postnasal drip 10/20/2018  . Migraine headache without aura 05/16/2018  . Irritable bowel syndrome with constipation 05/16/2018  . History of nephrolithiasis   . Geographic tongue   . Chronic insomnia   . History of skin cancer     Past Surgical History:  Procedure Laterality Date  . BASAL CELL CARCINOMA EXCISION Right    arm  . NO PAST SURGERIES    . WISDOM TOOTH EXTRACTION      Social History   Tobacco Use  . Smoking status: Never Smoker  . Smokeless tobacco: Never Used  Substance Use Topics  . Alcohol use: Yes    Comment: Occasionally    Family History  Problem Relation Age of Onset  . Diabetes Father   . Skin cancer Father   . Breast cancer Maternal Grandmother      Current medication list and  allergy/intolerance information reviewed:    Current Outpatient Medications  Medication Sig Dispense Refill  . loratadine-pseudoephedrine (CLARITIN-D 24-HOUR) 10-240 MG per 24 hr tablet Take 1 tablet by mouth daily.    . Multiple Vitamins-Minerals (MULTIVITAMIN PO) Take by mouth.    . Polyethylene Glycol 3350 (MIRALAX PO) Take by mouth daily.     . promethazine (PHENERGAN) 25 MG tablet Take by mouth as directed.    . Simethicone 180 MG CAPS Take 1 capsule (180 mg total) by mouth 4 (four) times daily as needed (gas/flatulance/bloating). 30 capsule 0  . Specialty Vitamins Products (VITAMINS FOR HAIR PO) Take by mouth.    . SUMAtriptan (IMITREX) 50 MG tablet TAKE 1 TABLET (50 MG TOTAL) BY MOUTH DAILY AS NEEDED FOR MIGRAINE. MAY REPEAT IN 2 HOURS IF HEADACHE PERSISTS OR RECURS. 10 tablet 2  . tamsulosin (FLOMAX) 0.4 MG CAPS capsule TAKE 1 CAPSULE BY MOUTH EVERY DAY (Patient taking differently: Take 0.4 mg by mouth daily as needed. ) 90 capsule 0  . zolpidem (AMBIEN) 10 MG tablet Take 0.5-1 tablets (5-10 mg total) by mouth at bedtime as needed for sleep. 60 tablet 3   No current facility-administered medications for this visit.    Allergies  Allergen Reactions  . Codeine Nausea Only      Review of Systems:  Constitutional:  No  fever, no chills, No recent illness, + unintentional weight changes. No significant fatigue.   HEENT: + cyclical headache, no vision change, + hearing change, No  sore throat, No  sinus pressure  Cardiac: No  chest pain, No  pressure, + palpitations, No  Orthopnea  Respiratory:  No shortness of breath. + Cough  Gastrointestinal: No  abdominal pain, No  nausea, No  vomiting,  No  blood in stool, No  diarrhea, + chronic constipation.  Musculoskeletal: No new myalgia/arthralgia  Skin: No  Rash, No other wounds/concerning lesions  Genitourinary: No  incontinence, No  abnormal genital bleeding, No abnormal genital discharge  Hem/Onc: No  easy bruising/bleeding,  No  abnormal lymph node  Endocrine: No cold intolerance,  No heat intolerance. No polyuria/polydipsia/polyphagia   Neurologic: No  weakness, No  dizziness, No  slurred speech/focal weakness/facial droop  Psychiatric: No  concerns with depression, + concerns with anxiety, No sleep problems, No mood problems  Exam:  BP 111/81   Pulse 86   Temp 98.2 F (36.8 C) (Oral)   Ht '5\' 4"'  (1.626 m)   Wt 108 lb 4.8 oz (49.1 kg)   LMP 11/30/2019   SpO2 99%   BMI 18.59 kg/m   Constitutional: VS see above. General Appearance: alert, well-developed, well-nourished, NAD  Eyes: Normal lids and conjunctive, non-icteric sclera  Ears, Nose, Mouth, Throat: MMM, Normal external inspection ears/nares/mouth/lips/gums. TM normal bilaterally. Pharynx/tonsils no erythema, no exudate. Nasal mucosa normal.   Neck: No masses, trachea midline. No thyroid enlargement. No tenderness/mass appreciated. No lymphadenopathy  Respiratory: Normal respiratory effort. no wheeze, no rhonchi, no rales  Cardiovascular: S1/S2 normal, no murmur, no rub/gallop auscultated. RRR. No lower extremity edema. Pedal pulse II/IV bilaterally DP and PT. No carotid bruit or JVD. No abdominal aortic bruit.  Gastrointestinal: Nontender, no masses. No hepatomegaly, no splenomegaly. No hernia appreciated. Bowel sounds normal. Rectal exam deferred.   Musculoskeletal: Gait normal. No clubbing/cyanosis of digits.   Neurological: Normal balance/coordination. No tremor. No cranial nerve deficit on limited exam. Motor and sensation intact and symmetric. Cerebellar reflexes intact.   Skin: warm, dry, intact. No rash/ulcer. No concerning nevi or subq nodules on limited exam.    Psychiatric: Normal judgment/insight. Normal mood and affect. Oriented x3.   Breasts: breasts appear normal, no suspicious masses, no skin or nipple changes or axillary nodes.  Pelvic exam: VULVA: normal appearing vulva with no masses, tenderness or lesions, VAGINA:  normal appearing vagina with normal color and discharge, no lesions, currently on menses, CERVIX: lesions absent, cervical discharge present - bloody, cervical motion tenderness absent, multiparous os, UTERUS: uterus is normal size, shape, consistency and nontender, mild uterine prolapse, PAP: Pap smear done today, HPV test, exam chaperoned by Glennie Hawk, MA.    No results found for this or any previous visit (from the past 72 hour(s)).  No results found.   ASSESSMENT/PLAN:   1. Need for Tdap vaccination Tdap administered in office by MA. - Tdap vaccine greater than or equal to 7yo IM  2. Need for influenza vaccination Flu vaccine administered in office by MA. - Flu Vaccine QUAD 36+ mos IM  3. Annual physical exam Checking CBC, CMP, and lipid panel today.  Pap smear with HPV cotesting completed. - Cytology - PAP - CBC - COMPLETE METABOLIC PANEL WITH GFR - Lipid panel  4. Screening for endocrine disorder Family history of thyroid disease and several family members.  Checking TSH today. - TSH  5. Screening for cervical cancer Pap smear completed with HPV cotesting today. - Cytology - PAP  6. Encounter for screening mammogram for malignant neoplasm of breast Mammogram ordered as she will be turning 39  next week. - MM 3D SCREEN BREAST BILATERAL; Future  7. Encounter for screening for HIV Discussed screening recommendations.  She is agreeable so we will add this to blood work today. - HIV Antibody (routine testing w rflx)  8. Encounter for hepatitis C screening test for low risk patient Discussed screening recommendations.  She is agreeable so we will also add this to blood work today. - Hepatitis C antibody   Orders Placed This Encounter  Procedures  . MM 3D SCREEN BREAST BILATERAL  . Tdap vaccine greater than or equal to 7yo IM  . Flu Vaccine QUAD 36+ mos IM  . HIV Antibody (routine testing w rflx)  . Hepatitis C antibody  . CBC  . COMPLETE METABOLIC PANEL  WITH GFR  . Lipid panel  . TSH    No orders of the defined types were placed in this encounter.   Patient Instructions  Preventive Care 30-69 Years Old, Female Preventive care refers to visits with your health care provider and lifestyle choices that can promote health and wellness. This includes:  A yearly physical exam. This may also be called an annual well check.  Regular dental visits and eye exams.  Immunizations.  Screening for certain conditions.  Healthy lifestyle choices, such as eating a healthy diet, getting regular exercise, not using drugs or products that contain nicotine and tobacco, and limiting alcohol use. What can I expect for my preventive care visit? Physical exam Your health care provider will check your:  Height and weight. This may be used to calculate body mass index (BMI), which tells if you are at a healthy weight.  Heart rate and blood pressure.  Skin for abnormal spots. Counseling Your health care provider may ask you questions about your:  Alcohol, tobacco, and drug use.  Emotional well-being.  Home and relationship well-being.  Sexual activity.  Eating habits.  Work and work Statistician.  Method of birth control.  Menstrual cycle.  Pregnancy history. What immunizations do I need?  Influenza (flu) vaccine  This is recommended every year. Tetanus, diphtheria, and pertussis (Tdap) vaccine  You may need a Td booster every 10 years. Varicella (chickenpox) vaccine  You may need this if you have not been vaccinated. Human papillomavirus (HPV) vaccine  If recommended by your health care provider, you may need three doses over 6 months. Measles, mumps, and rubella (MMR) vaccine  You may need at least one dose of MMR. You may also need a second dose. Meningococcal conjugate (MenACWY) vaccine  One dose is recommended if you are age 35-21 years and a first-year college student living in a residence hall, or if you have one of  several medical conditions. You may also need additional booster doses. Pneumococcal conjugate (PCV13) vaccine  You may need this if you have certain conditions and were not previously vaccinated. Pneumococcal polysaccharide (PPSV23) vaccine  You may need one or two doses if you smoke cigarettes or if you have certain conditions. Hepatitis A vaccine  You may need this if you have certain conditions or if you travel or work in places where you may be exposed to hepatitis A. Hepatitis B vaccine  You may need this if you have certain conditions or if you travel or work in places where you may be exposed to hepatitis B. Haemophilus influenzae type b (Hib) vaccine  You may need this if you have certain conditions. You may receive vaccines as individual doses or as more than one vaccine together in one shot (combination  vaccines). Talk with your health care provider about the risks and benefits of combination vaccines. What tests do I need?  Blood tests  Lipid and cholesterol levels. These may be checked every 5 years starting at age 81.  Hepatitis C test.  Hepatitis B test. Screening  Diabetes screening. This is done by checking your blood sugar (glucose) after you have not eaten for a while (fasting).  Sexually transmitted disease (STD) testing.  BRCA-related cancer screening. This may be done if you have a family history of breast, ovarian, tubal, or peritoneal cancers.  Pelvic exam and Pap test. This may be done every 3 years starting at age 76. Starting at age 40, this may be done every 5 years if you have a Pap test in combination with an HPV test. Talk with your health care provider about your test results, treatment options, and if necessary, the need for more tests. Follow these instructions at home: Eating and drinking   Eat a diet that includes fresh fruits and vegetables, whole grains, lean protein, and low-fat dairy.  Take vitamin and mineral supplements as  recommended by your health care provider.  Do not drink alcohol if: ? Your health care provider tells you not to drink. ? You are pregnant, may be pregnant, or are planning to become pregnant.  If you drink alcohol: ? Limit how much you have to 0-1 drink a day. ? Be aware of how much alcohol is in your drink. In the [place] one drink equals one 12 oz bottle of beer (355 mL), one 5 oz glass of wine (148 mL), or one 1 oz glass of hard liquor (44 mL). Lifestyle  Take daily care of your teeth and gums.  Stay active. Exercise for at least 30 minutes on 5 or more days each week.  Do not use any products that contain nicotine or tobacco, such as cigarettes, e-cigarettes, and chewing tobacco. If you need help quitting, ask your health care provider.  If you are sexually active, practice safe sex. Use a condom or other form of birth control (contraception) in order to prevent pregnancy and STIs (sexually transmitted infections). If you plan to become pregnant, see your health care provider for a preconception visit. What's next?  Visit your health care provider once a year for a well check visit.  Ask your health care provider how often you should have your eyes and teeth checked.  Stay up to date on all vaccines. This information is not intended to replace advice given to you by your health care provider. Make sure you discuss any questions you have with your health care provider. Document Revised: 12/01/2017 Document Reviewed: 12/01/2017 Elsevier Patient Education  [place].  Follow-up plan: Return in about 1 year (around 11/29/2020) for annual physical exam or sooner if needed.  Clearnce Sorrel, DNP, APRN, FNP-BC [facility]

## 2019-11-30 NOTE — Patient Instructions (Signed)
Preventive Care 21-39 Years Old, Female Preventive care refers to visits with your health care provider and lifestyle choices that can promote health and wellness. This includes:  A yearly physical exam. This may also be called an annual well check.  Regular dental visits and eye exams.  Immunizations.  Screening for certain conditions.  Healthy lifestyle choices, such as eating a healthy diet, getting regular exercise, not using drugs or products that contain nicotine and tobacco, and limiting alcohol use. What can I expect for my preventive care visit? Physical exam Your health care provider will check your:  Height and weight. This may be used to calculate body mass index (BMI), which tells if you are at a healthy weight.  Heart rate and blood pressure.  Skin for abnormal spots. Counseling Your health care provider may ask you questions about your:  Alcohol, tobacco, and drug use.  Emotional well-being.  Home and relationship well-being.  Sexual activity.  Eating habits.  Work and work environment.  Method of birth control.  Menstrual cycle.  Pregnancy history. What immunizations do I need?  Influenza (flu) vaccine  This is recommended every year. Tetanus, diphtheria, and pertussis (Tdap) vaccine  You may need a Td booster every 10 years. Varicella (chickenpox) vaccine  You may need this if you have not been vaccinated. Human papillomavirus (HPV) vaccine  If recommended by your health care provider, you may need three doses over 6 months. Measles, mumps, and rubella (MMR) vaccine  You may need at least one dose of MMR. You may also need a second dose. Meningococcal conjugate (MenACWY) vaccine  One dose is recommended if you are age 19-21 years and a first-year college student living in a residence hall, or if you have one of several medical conditions. You may also need additional booster doses. Pneumococcal conjugate (PCV13) vaccine  You may need  this if you have certain conditions and were not previously vaccinated. Pneumococcal polysaccharide (PPSV23) vaccine  You may need one or two doses if you smoke cigarettes or if you have certain conditions. Hepatitis A vaccine  You may need this if you have certain conditions or if you travel or work in places where you may be exposed to hepatitis A. Hepatitis B vaccine  You may need this if you have certain conditions or if you travel or work in places where you may be exposed to hepatitis B. Haemophilus influenzae type b (Hib) vaccine  You may need this if you have certain conditions. You may receive vaccines as individual doses or as more than one vaccine together in one shot (combination vaccines). Talk with your health care provider about the risks and benefits of combination vaccines. What tests do I need?  Blood tests  Lipid and cholesterol levels. These may be checked every 5 years starting at age 20.  Hepatitis C test.  Hepatitis B test. Screening  Diabetes screening. This is done by checking your blood sugar (glucose) after you have not eaten for a while (fasting).  Sexually transmitted disease (STD) testing.  BRCA-related cancer screening. This may be done if you have a family history of breast, ovarian, tubal, or peritoneal cancers.  Pelvic exam and Pap test. This may be done every 3 years starting at age 21. Starting at age 30, this may be done every 5 years if you have a Pap test in combination with an HPV test. Talk with your health care provider about your test results, treatment options, and if necessary, the need for more tests.   Follow these instructions at home: Eating and drinking   Eat a diet that includes fresh fruits and vegetables, whole grains, lean protein, and low-fat dairy.  Take vitamin and mineral supplements as recommended by your health care provider.  Do not drink alcohol if: ? Your health care provider tells you not to drink. ? You are  pregnant, may be pregnant, or are planning to become pregnant.  If you drink alcohol: ? Limit how much you have to 0-1 drink a day. ? Be aware of how much alcohol is in your drink. In the [place] one drink equals one 12 oz bottle of beer (355 mL), one 5 oz glass of wine (148 mL), or one 1 oz glass of hard liquor (44 mL). Lifestyle  Take daily care of your teeth and gums.  Stay active. Exercise for at least 30 minutes on 5 or more days each week.  Do not use any products that contain nicotine or tobacco, such as cigarettes, e-cigarettes, and chewing tobacco. If you need help quitting, ask your health care provider.  If you are sexually active, practice safe sex. Use a condom or other form of birth control (contraception) in order to prevent pregnancy and STIs (sexually transmitted infections). If you plan to become pregnant, see your health care provider for a preconception visit. What's next?  Visit your health care provider once a year for a well check visit.  Ask your health care provider how often you should have your eyes and teeth checked.  Stay up to date on all vaccines. This information is not intended to replace advice given to you by your health care provider. Make sure you discuss any questions you have with your health care provider. Document Revised: 12/01/2017 Document Reviewed: 12/01/2017 Elsevier Patient Education  2020 [employer].

## 2019-12-03 ENCOUNTER — Ambulatory Visit: Payer: 59

## 2019-12-03 ENCOUNTER — Other Ambulatory Visit: Payer: Self-pay

## 2019-12-03 LAB — CYTOLOGY - PAP
Comment: NEGATIVE
Diagnosis: NEGATIVE
High risk HPV: NEGATIVE

## 2019-12-06 ENCOUNTER — Ambulatory Visit (INDEPENDENT_AMBULATORY_CARE_PROVIDER_SITE_OTHER): Payer: 59

## 2019-12-06 ENCOUNTER — Other Ambulatory Visit: Payer: Self-pay

## 2019-12-06 DIAGNOSIS — Z1231 Encounter for screening mammogram for malignant neoplasm of breast: Secondary | ICD-10-CM | POA: Diagnosis not present

## 2019-12-11 LAB — COMPLETE METABOLIC PANEL WITH GFR
AG Ratio: 1.8 (calc) (ref 1.0–2.5)
ALT: 9 U/L (ref 6–29)
AST: 16 U/L (ref 10–30)
Albumin: 4.4 g/dL (ref 3.6–5.1)
Alkaline phosphatase (APISO): 73 U/L (ref 31–125)
BUN: 13 mg/dL (ref 7–25)
CO2: 29 mmol/L (ref 20–32)
Calcium: 9.6 mg/dL (ref 8.6–10.2)
Chloride: 102 mmol/L (ref 98–110)
Creat: 0.61 mg/dL (ref 0.50–1.10)
GFR, Est African American: 131 mL/min/{1.73_m2} (ref >=60)
GFR, Est Non African American: 113 mL/min/{1.73_m2} (ref >=60)
Globulin: 2.4 g/dL (calc) (ref 1.9–3.7)
Glucose, Bld: 94 mg/dL (ref 65–139)
Potassium: 5.2 mmol/L (ref 3.5–5.3)
Sodium: 137 mmol/L (ref 135–146)
Total Bilirubin: 0.4 mg/dL (ref 0.2–1.2)
Total Protein: 6.8 g/dL (ref 6.1–8.1)

## 2019-12-11 LAB — HEPATITIS C ANTIBODY
Hepatitis C Ab: NONREACTIVE
SIGNAL TO CUT-OFF: 0.01 (ref <1.00)

## 2019-12-11 LAB — LIPID PANEL
Cholesterol: 193 mg/dL (ref <200)
HDL: 80 mg/dL (ref >=50)
LDL Cholesterol (Calc): 94 mg/dL (calc)
Non-HDL Cholesterol (Calc): 113 mg/dL (calc) (ref <130)
Total CHOL/HDL Ratio: 2.4 (calc) (ref <5.0)
Triglycerides: 101 mg/dL (ref <150)

## 2019-12-11 LAB — CBC
HCT: 39.7 % (ref 35.0–45.0)
Hemoglobin: 13.2 g/dL (ref 11.7–15.5)
MCH: 30.3 pg (ref 27.0–33.0)
MCHC: 33.2 g/dL (ref 32.0–36.0)
MCV: 91.1 fL (ref 80.0–100.0)
MPV: 10.4 fL (ref 7.5–12.5)
Platelets: 325 10*3/uL (ref 140–400)
RBC: 4.36 10*6/uL (ref 3.80–5.10)
RDW: 13.2 % (ref 11.0–15.0)
WBC: 5 10*3/uL (ref 3.8–10.8)

## 2019-12-11 LAB — TSH: TSH: 2.47 mIU/L

## 2019-12-11 LAB — HIV ANTIBODY (ROUTINE TESTING W REFLEX): HIV 1&2 Ab, 4th Generation: NONREACTIVE

## 2019-12-12 ENCOUNTER — Encounter: Payer: Self-pay | Admitting: Medical-Surgical

## 2019-12-12 DIAGNOSIS — K581 Irritable bowel syndrome with constipation: Secondary | ICD-10-CM

## 2019-12-13 ENCOUNTER — Encounter: Payer: Self-pay | Admitting: Medical-Surgical

## 2019-12-13 ENCOUNTER — Telehealth (INDEPENDENT_AMBULATORY_CARE_PROVIDER_SITE_OTHER): Payer: 59 | Admitting: Medical-Surgical

## 2019-12-13 DIAGNOSIS — F411 Generalized anxiety disorder: Secondary | ICD-10-CM | POA: Diagnosis not present

## 2019-12-13 DIAGNOSIS — F5104 Psychophysiologic insomnia: Secondary | ICD-10-CM

## 2019-12-13 MED ORDER — HYDROXYZINE PAMOATE 25 MG PO CAPS: 25.0000 mg | ORAL_CAPSULE | Freq: Three times a day (TID) | ORAL | 0 refills | Status: DC | PRN

## 2019-12-13 MED ORDER — AMITRIPTYLINE HCL 25 MG PO TABS: 25.0000 mg | ORAL_TABLET | Freq: Every day | ORAL | 1 refills | Status: DC

## 2019-12-13 NOTE — Progress Notes (Signed)
Virtual Visit via Video Note  I connected with Dana Robertson on 12/13/19 at  2:40 PM EDT by a video enabled telemedicine application and verified that I am speaking with the correct person using two identifiers.   I discussed the limitations of evaluation and management by telemedicine and the availability of in person appointments. The patient expressed understanding and agreed to proceed.  Patient location: home Provider locations: office  Subjective:    CC: insomnia/anxiety concerns  HPI: Pleasant 39 year old female presenting via MyChart video visit to discuss insomnia and anxiety. She has had quite a few changes since our visit.  After our last appointment a couple of weeks ago, her husband went into inpatient rehab for 28 days for alcohol dependence.  She is caring for her small children at home and trying to maintain her responsibilities there.  She is also job hunting and going to interviews.  With her increase in stress in the recent life changes, she notes that she has had increasing difficulty sleeping at night.  She does have a chronic history of insomnia and has been taking Ambien 5 -10 mg nightly for sleep for about 6 years.  Over the past week, she notes that the Ambien has not been working as well and she struggles to get to sleep at night.  Once she is asleep, there is no difficulty staying asleep.  Unfortunately, when she lays down it gets quiet at night her mind begins to race thinking about all the things that happened that day and everything that will be happening the next day.  She has wanted to get off of Ambien for a while now as she is concerned about its connection with possible dementia/Alzheimer's and memory issues.  She has tried several things in the past including Lunesta, trazodone, melatonin, and over-the-counter remedies.  None of these medications were helpful for sleep.  Her ultimate goal is to be able to sleep well at night without being dependent on medication  she is currently doing therapy to help manage her anxiety and stress.  Denies SI/HI.  Past medical history, Surgical history, Family history not pertinant except as noted below, Social history, Allergies, and medications have been entered into the medical record, reviewed, and corrections made.   Review of Systems: See HPI for pertinent positives and negatives.   Objective:    General: Speaking clearly in complete sentences without any shortness of breath.  Alert and oriented x3.  Normal judgment. No apparent acute distress.  Impression and Recommendations:    1. Chronic insomnia/GAD Discussed various options for treating insomnia.  According to the most up-to-date information, benzodiazepines are no longer recommended for treating insomnia.  Discussed possibly adding hydroxyzine for anxiety to nightly Ambien or switching agents altogether.  After options presented, patient would like to try amitriptyline 25 mg nightly to help with sleep.  Advised patient to try this for at least 2 weeks to see if this is helpful.  Discussed possible side effects, especially with the first few doses.  At the 2-week point, if 25 mg nightly is not as helpful, she will reach out to me.  We may need to increase to 50 mg nightly at that point.  Also sending in hydroxyzine 25 mg 3 times daily as needed for anxiety.  Advised caution when taking this medicine with the amitriptyline due to the dual sedative effect.  This may be better utilized by taking about 1 hour before the amitriptyline or if she is still having difficulty sleeping an  hour after taking the amitriptyline.  Discussed psychological emergency resources.  No follow-ups on file.  20 minutes of non-face-to-face time was provided during this encounter.  I discussed the assessment and treatment plan with the patient. The patient was provided an opportunity to ask questions and all were answered. The patient agreed with the plan and demonstrated an understanding  of the instructions.   The patient was advised to call back or seek an in-person evaluation if the symptoms worsen or if the condition fails to improve as anticipated.  Thayer Ohm, DNP, APRN, FNP-BC  [facility]

## 2019-12-14 ENCOUNTER — Telehealth: Payer: 59 | Admitting: Medical-Surgical

## 2019-12-31 ENCOUNTER — Other Ambulatory Visit: Payer: Self-pay | Admitting: *Deleted

## 2019-12-31 ENCOUNTER — Other Ambulatory Visit: Payer: Self-pay | Admitting: Medical-Surgical

## 2019-12-31 DIAGNOSIS — Z0184 Encounter for antibody response examination: Secondary | ICD-10-CM

## 2020-01-24 ENCOUNTER — Other Ambulatory Visit: Payer: Self-pay | Admitting: Medical-Surgical

## 2020-01-24 MED ORDER — AMITRIPTYLINE HCL 25 MG PO TABS: 50.0000 mg | ORAL_TABLET | Freq: Every day | ORAL | 1 refills | Status: DC

## 2020-04-01 ENCOUNTER — Encounter: Payer: Self-pay | Admitting: Medical-Surgical

## 2020-04-01 DIAGNOSIS — K581 Irritable bowel syndrome with constipation: Secondary | ICD-10-CM

## 2020-04-02 ENCOUNTER — Other Ambulatory Visit: Payer: Self-pay | Admitting: Medical-Surgical

## 2020-04-02 DIAGNOSIS — K581 Irritable bowel syndrome with constipation: Secondary | ICD-10-CM

## 2020-04-02 MED ORDER — TRULANCE 3 MG PO TABS: 3.0000 mg | ORAL_TABLET | Freq: Every day | ORAL | 2 refills | Status: DC

## 2020-05-19 ENCOUNTER — Encounter: Payer: Self-pay | Admitting: Medical-Surgical

## 2020-05-19 DIAGNOSIS — G43009 Migraine without aura, not intractable, without status migrainosus: Secondary | ICD-10-CM

## 2020-05-20 MED ORDER — SUMATRIPTAN SUCCINATE 50 MG PO TABS: 50.0000 mg | ORAL_TABLET | Freq: Every day | ORAL | 2 refills | Status: DC | PRN

## 2020-05-23 ENCOUNTER — Other Ambulatory Visit: Payer: Self-pay

## 2020-05-23 MED ORDER — AMITRIPTYLINE HCL 25 MG PO TABS: 50.0000 mg | ORAL_TABLET | Freq: Every day | ORAL | 1 refills | Status: DC

## 2020-06-08 ENCOUNTER — Encounter: Payer: Self-pay | Admitting: Medical-Surgical

## 2020-06-10 ENCOUNTER — Other Ambulatory Visit: Payer: Self-pay

## 2020-06-10 MED ORDER — HYDROXYZINE PAMOATE 25 MG PO CAPS: 25.0000 mg | ORAL_CAPSULE | Freq: Three times a day (TID) | ORAL | 0 refills | Status: DC | PRN

## 2020-07-04 ENCOUNTER — Encounter: Payer: Self-pay | Admitting: Medical-Surgical

## 2020-07-04 DIAGNOSIS — K581 Irritable bowel syndrome with constipation: Secondary | ICD-10-CM

## 2020-09-24 ENCOUNTER — Encounter: Payer: Self-pay | Admitting: Medical-Surgical

## 2020-09-24 DIAGNOSIS — G43009 Migraine without aura, not intractable, without status migrainosus: Secondary | ICD-10-CM

## 2020-09-24 MED ORDER — SUMATRIPTAN SUCCINATE 50 MG PO TABS: 50.0000 mg | ORAL_TABLET | Freq: Every day | ORAL | 2 refills | Status: DC | PRN

## 2020-10-02 MED ORDER — AMITRIPTYLINE HCL 25 MG PO TABS: 50.0000 mg | ORAL_TABLET | Freq: Every day | ORAL | 1 refills | Status: DC

## 2020-11-26 ENCOUNTER — Other Ambulatory Visit: Payer: Self-pay | Admitting: Medical-Surgical

## 2020-11-26 ENCOUNTER — Encounter: Payer: Self-pay | Admitting: Medical-Surgical

## 2020-12-01 ENCOUNTER — Encounter: Payer: Self-pay | Admitting: Medical-Surgical

## 2020-12-01 ENCOUNTER — Telehealth (INDEPENDENT_AMBULATORY_CARE_PROVIDER_SITE_OTHER): Payer: Managed Care, Other (non HMO) | Admitting: Medical-Surgical

## 2020-12-01 ENCOUNTER — Other Ambulatory Visit: Payer: Self-pay

## 2020-12-01 DIAGNOSIS — F411 Generalized anxiety disorder: Secondary | ICD-10-CM | POA: Diagnosis not present

## 2020-12-01 DIAGNOSIS — G43009 Migraine without aura, not intractable, without status migrainosus: Secondary | ICD-10-CM

## 2020-12-01 DIAGNOSIS — F5104 Psychophysiologic insomnia: Secondary | ICD-10-CM | POA: Diagnosis not present

## 2020-12-01 MED ORDER — SUMATRIPTAN SUCCINATE 100 MG PO TABS: 100.0000 mg | ORAL_TABLET | Freq: Every day | ORAL | 2 refills | Status: DC | PRN

## 2020-12-01 MED ORDER — HYDROXYZINE PAMOATE 25 MG PO CAPS: 25.0000 mg | ORAL_CAPSULE | Freq: Three times a day (TID) | ORAL | 1 refills | Status: DC | PRN

## 2020-12-01 MED ORDER — AMITRIPTYLINE HCL 25 MG PO TABS: 25.0000 mg | ORAL_TABLET | Freq: Every day | ORAL | 1 refills | Status: DC

## 2020-12-01 NOTE — Progress Notes (Signed)
Virtual Visit via Video Note  I connected with Dana Robertson on 12/01/20 at  8:10 AM EDT by a video enabled telemedicine application and verified that I am speaking with the correct person using two identifiers.   I discussed the limitations of evaluation and management by telemedicine and the availability of in person appointments. The patient expressed understanding and agreed to proceed.  Patient location: home Provider locations: office  Subjective:    CC: Migraine follow-up  HPI: Very pleasant 40 year old female presenting via MyChart video visit for migraine follow-up.  She has been doing well on amitriptyline 25-50 mg nightly.  Knows this helps treat her mood and keep her anxiety fairly well controlled but does also seem to help some with her migraines.  She does still have some phases where she has a bad week of migraines.  She uses Imitrex 50 mg tablets 1-2 doses per day for these headaches.  She feels the medication is effective however she does get a bit anxious when her 10 tablets supply starts to run low.  During her last bad week of migraines, she used 2 tablets nearly every day to manage her migraines.    Past medical history, Surgical history, Family history not pertinant except as noted below, Social history, Allergies, and medications have been entered into the medical record, reviewed, and corrections made.   Review of Systems: See HPI for pertinent positives and negatives.   Objective:    General: Speaking clearly in complete sentences without any shortness of breath.  Alert and oriented x3.  Normal judgment. No apparent acute distress.  Impression and Recommendations:    1. Migraine without aura and without status migrainosus, not intractable Since she is using 100 mg total of sumatriptan recently, sending in sumatriptan 100 mg tablets.  Can cut these in half and use 50 mg if desired but may consider using full 100 mg to see if this successfully resolves migraines.   Okay to take a second dose 2 hours later but do not exceed 200 mg in a 24-hour period.  2. Chronic insomnia 3. Generalized anxiety disorder Continue amitriptyline 25-75 mg nightly.  Continue hydroxyzine 25 mg at bedtime as needed.  I discussed the assessment and treatment plan with the patient. The patient was provided an opportunity to ask questions and all were answered. The patient agreed with the plan and demonstrated an understanding of the instructions.   The patient was advised to call back or seek an in-person evaluation if the symptoms worsen or if the condition fails to improve as anticipated.  20 minutes of non-face-to-face time was provided during this encounter.  Return for Annual physical exam as scheduled in September.  Thayer Ohm, DNP, APRN, FNP-BC  [facility]

## 2020-12-26 ENCOUNTER — Encounter: Payer: 59 | Admitting: Medical-Surgical

## 2021-01-06 ENCOUNTER — Other Ambulatory Visit: Payer: Self-pay | Admitting: Medical-Surgical

## 2021-01-06 DIAGNOSIS — Z1231 Encounter for screening mammogram for malignant neoplasm of breast: Secondary | ICD-10-CM

## 2021-01-15 ENCOUNTER — Other Ambulatory Visit: Payer: Self-pay

## 2021-01-15 ENCOUNTER — Ambulatory Visit (INDEPENDENT_AMBULATORY_CARE_PROVIDER_SITE_OTHER): Payer: Managed Care, Other (non HMO)

## 2021-01-15 DIAGNOSIS — Z1231 Encounter for screening mammogram for malignant neoplasm of breast: Secondary | ICD-10-CM

## 2021-02-11 ENCOUNTER — Other Ambulatory Visit: Payer: Self-pay

## 2021-02-11 ENCOUNTER — Ambulatory Visit (INDEPENDENT_AMBULATORY_CARE_PROVIDER_SITE_OTHER): Payer: 59 | Admitting: Medical-Surgical

## 2021-02-11 ENCOUNTER — Encounter: Payer: Self-pay | Admitting: Medical-Surgical

## 2021-02-11 VITALS — BP 114/77 | HR 88 | Resp 20 | Ht 64.0 in | Wt 135.0 lb

## 2021-02-11 DIAGNOSIS — Z0000 Encounter for general adult medical examination without abnormal findings: Secondary | ICD-10-CM | POA: Diagnosis not present

## 2021-02-11 MED ORDER — AMITRIPTYLINE HCL 25 MG PO TABS: 25.0000 mg | ORAL_TABLET | Freq: Every day | ORAL | 3 refills | Status: DC

## 2021-02-11 MED ORDER — HYDROXYZINE PAMOATE 25 MG PO CAPS: 25.0000 mg | ORAL_CAPSULE | Freq: Three times a day (TID) | ORAL | 3 refills | Status: DC | PRN

## 2021-02-11 MED ORDER — SUMATRIPTAN SUCCINATE 100 MG PO TABS: 100.0000 mg | ORAL_TABLET | Freq: Every day | ORAL | 2 refills | Status: DC | PRN

## 2021-02-11 NOTE — Patient Instructions (Signed)
Preventive Care 92-41 Years Old, Female Preventive care refers to lifestyle choices and visits with your health care provider that can promote health and wellness. Preventive care visits are also called wellness exams. What can I expect for my preventive care visit? Counseling Your health care provider may ask you questions about your: Medical history, including: Past medical problems. Family medical history. Pregnancy history. Current health, including: Menstrual cycle. Method of birth control. Emotional well-being. Home life and relationship well-being. Sexual activity and sexual health. Lifestyle, including: Alcohol, nicotine or tobacco, and drug use. Access to firearms. Diet, exercise, and sleep habits. Work and work Statistician. Sunscreen use. Safety issues such as seatbelt and bike helmet use. Physical exam Your health care provider will check your: Height and weight. These may be used to calculate your BMI (body mass index). BMI is a measurement that tells if you are at a healthy weight. Waist circumference. This measures the distance around your waistline. This measurement also tells if you are at a healthy weight and may help predict your risk of certain diseases, such as type 2 diabetes and high blood pressure. Heart rate and blood pressure. Body temperature. Skin for abnormal spots. What immunizations do I need? Vaccines are usually given at various ages, according to a schedule. Your health care provider will recommend vaccines for you based on your age, medical history, and lifestyle or other factors, such as travel or where you work. What tests do I need? Screening Your health care provider may recommend screening tests for certain conditions. This may include: Lipid and cholesterol levels. Diabetes screening. This is done by checking your blood sugar (glucose) after you have not eaten for a while (fasting). Pelvic exam and Pap test. Hepatitis B test. Hepatitis C  test. HIV (human immunodeficiency virus) test. STI (sexually transmitted infection) testing, if you are at risk. Lung cancer screening. Colorectal cancer screening. Mammogram. Talk with your health care provider about when you should start having regular mammograms. This may depend on whether you have a family history of breast cancer. BRCA-related cancer screening. This may be done if you have a family history of breast, ovarian, tubal, or peritoneal cancers. Bone density scan. This is done to screen for osteoporosis. Talk with your health care provider about your test results, treatment options, and if necessary, the need for more tests. Follow these instructions at home: Eating and drinking  Eat a diet that includes fresh fruits and vegetables, whole grains, lean protein, and low-fat dairy products. Take vitamin and mineral supplements as recommended by your health care provider. Do not drink alcohol if: Your health care provider tells you not to drink. You are pregnant, may be pregnant, or are planning to become pregnant. If you drink alcohol: Limit how much you have to 0-1 drink a day. Know how much alcohol is in your drink. In the [place] one drink equals one 12 oz bottle of beer (355 mL), one 5 oz glass of wine (148 mL), or one 1 oz glass of hard liquor (44 mL). Lifestyle Brush your teeth every morning and night with fluoride toothpaste. Floss one time each day. Exercise for at least 30 minutes 5 or more days each week. Do not use any products that contain nicotine or tobacco. These products include cigarettes, chewing tobacco, and vaping devices, such as e-cigarettes. If you need help quitting, ask your health care provider. Do not use drugs. If you are sexually active, practice safe sex. Use a condom or other form of protection to prevent  STIs. If you do not wish to become pregnant, use a form of birth control. If you plan to become pregnant, see your health care provider for a  prepregnancy visit. Take aspirin only as told by your health care provider. Make sure that you understand how much to take and what form to take. Work with your health care provider to find out whether it is safe and beneficial for you to take aspirin daily. Find healthy ways to manage stress, such as: Meditation, yoga, or listening to music. Journaling. Talking to a trusted person. Spending time with friends and family. Minimize exposure to UV radiation to reduce your risk of skin cancer. Safety Always wear your seat belt while driving or riding in a vehicle. Do not drive: If you have been drinking alcohol. Do not ride with someone who has been drinking. When you are tired or distracted. While texting. If you have been using any mind-altering substances or drugs. Wear a helmet and other protective equipment during sports activities. If you have firearms in your house, make sure you follow all gun safety procedures. Seek help if you have been physically or sexually abused. What's next? Visit your health care provider once a year for an annual wellness visit. Ask your health care provider how often you should have your eyes and teeth checked. Stay up to date on all vaccines. This information is not intended to replace advice given to you by your health care provider. Make sure you discuss any questions you have with your health care provider. Document Revised: 09/17/2020 Document Reviewed: 09/17/2020 Elsevier Patient Education  [place].

## 2021-02-11 NOTE — Progress Notes (Signed)
HPI: Dana Robertson is a 40 y.o. female who  has a past medical history of Anxiety, Asthma, Chronic insomnia, Geographic tongue, History of nephrolithiasis, History of skin cancer, IBS (irritable bowel syndrome), and Plantar wart.  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  Notes that this is going to be a much lower stress job and she only has to work part-time.  Past medical, surgical, social and family history reviewed:  Patient Active Problem List   Diagnosis Date Noted   Generalized anxiety disorder 10/20/2018   Allergic rhinitis with postnasal drip 10/20/2018   Migraine headache without aura 05/16/2018   Irritable bowel syndrome with constipation 05/16/2018   History of nephrolithiasis    Geographic tongue    Chronic insomnia    History of skin cancer     Past Surgical History:  Procedure Laterality Date   BASAL CELL CARCINOMA EXCISION Right    arm   NO PAST SURGERIES     WISDOM TOOTH EXTRACTION      Social History   Tobacco Use   Smoking status: Never   Smokeless tobacco: Never  Substance Use Topics   Alcohol use: Yes    Comment: Occasionally    Family History  Problem Relation Age of Onset   Diabetes Father    Skin cancer Father    Breast cancer Maternal Grandmother      Current medication list and allergy/intolerance information reviewed:    Current Outpatient Medications  Medication Sig Dispense Refill   lubiprostone (AMITIZA) 24 MCG capsule Take by mouth 2 (two) times daily.     Multiple Vitamins-Minerals (MULTIVITAMIN PO) Take by  mouth.     Simethicone 180 MG CAPS Take 1 capsule (180 mg total) by mouth 4 (four) times daily as needed (gas/flatulance/bloating). 30 capsule 0   Specialty Vitamins Products (VITAMINS FOR HAIR PO) Take by mouth.     amitriptyline (ELAVIL) 25 MG tablet Take 1-3 tablets (25-75 mg total) by mouth at bedtime. 270 tablet 3   hydrOXYzine (VISTARIL) 25 MG capsule Take 1 capsule (25 mg total) by mouth 3 (three) times daily as needed. 90 capsule 3   SUMAtriptan (IMITREX) 100 MG tablet Take 1 tablet (100 mg total) by mouth daily as needed for migraine. May repeat in 2 hours if headache persists or recurs. 10 tablet 2   No current facility-administered medications for this visit.    Allergies  Allergen Reactions   Codeine Nausea Only      Review of Systems: Constitutional:  No  fever, no chills, No recent illness, + unintentional weight changes. No significant fatigue.  HEENT: + Headache, no vision change, no hearing change, No sore throat, No  sinus pressure Cardiac: No  chest pain, No  pressure, No palpitations, No  Orthopnea Respiratory:  No  shortness of breath. No  Cough Gastrointestinal: No  abdominal pain, No  nausea, No  vomiting,  No  blood in stool, No  diarrhea, + constipation  Musculoskeletal: No new myalgia/arthralgia Skin: No  Rash, No other wounds/concerning lesions Genitourinary: No  incontinence, No  abnormal genital bleeding, No abnormal genital discharge Hem/Onc: No  easy bruising/bleeding, No  abnormal lymph node Endocrine: No cold intolerance,  No heat intolerance. No polyuria/polydipsia/polyphagia  Neurologic: No  weakness, No  dizziness, No  slurred speech/focal weakness/facial droop Psychiatric: No concerns with depression, + concerns with anxiety, No sleep problems, No mood problems  Exam:  BP 114/77 (BP Location: Left Arm, Patient Position: Sitting, Cuff Size: Normal)   Pulse 88   Resp 20   Ht '5\' 4"'  (1.626 m)   Wt 135 lb (61.2 kg)   SpO2 99%   BMI 23.17 kg/m   Constitutional: VS see above. General Appearance: alert, well-developed, well-nourished, NAD Eyes: Normal lids and conjunctive, non-icteric sclera Ears, Nose, Mouth, Throat: MMM, Normal external inspection ears/nares/mouth/lips/gums. TM normal bilaterally.   Neck: No masses, trachea midline. No thyroid enlargement. No tenderness/mass appreciated. No lymphadenopathy Respiratory: Normal respiratory effort. no wheeze, no rhonchi, no rales Cardiovascular: S1/S2 normal, no murmur, no rub/gallop auscultated. RRR. No lower extremity edema. Pedal pulse II/IV bilaterally PT. No carotid bruit or JVD. No abdominal aortic bruit. Gastrointestinal: Nontender, no masses. No hepatomegaly, no splenomegaly. No hernia appreciated. Bowel sounds normal. Rectal exam deferred.  Musculoskeletal: Gait normal. No clubbing/cyanosis of digits.  Neurological: Normal balance/coordination. No tremor. No cranial nerve deficit on limited exam. Motor and sensation intact and symmetric. Cerebellar reflexes intact.  Skin: warm, dry, intact. No rash/ulcer. No concerning nevi or subq nodules on limited exam.   Psychiatric: Normal judgment/insight. Normal mood and affect. Oriented x3.    ASSESSMENT/PLAN:   1. Annual physical exam Checking labs.  Wellness information provided with AVS.  Recommend returning to regular intentional exercise to help with recent weight changes and mood management. - COMPLETE METABOLIC PANEL WITH GFR - CBC with Differential/Platelet - Lipid panel   Orders Placed This Encounter  Procedures   COMPLETE METABOLIC PANEL WITH GFR   CBC with Differential/Platelet   Lipid panel    Meds ordered this encounter  Medications   SUMAtriptan (IMITREX) 100 MG tablet    Sig: Take 1 tablet (100 mg total) by mouth daily as needed for migraine. May repeat in 2 hours if headache persists or recurs.    Dispense:  10 tablet    Refill:  2    Order Specific Question:   Supervising Provider    Answer:   MATTHEWS,  CODY [[id number]]   amitriptyline (ELAVIL) 25 MG tablet    Sig: Take 1-3 tablets (25-75 mg total) by mouth at bedtime.    Dispense:  270 tablet    Refill:  3    Order Specific Question:   Supervising Provider    Answer:   MATTHEWS, CODY [[id number]]   hydrOXYzine (VISTARIL) 25 MG capsule    Sig: Take 1 capsule (25 mg total) by mouth 3 (three) times daily as needed.    Dispense:  90 capsule    Refill:  3    Order Specific Question:   Supervising Provider    Answer:   [name] [[id number]]    Patient Instructions  Preventive Care 55-5 Years Old, Female Preventive care refers to lifestyle choices and visits with your health care provider that can promote health and wellness. Preventive care visits are also called wellness exams. What can I expect for my preventive care visit? Counseling Your health care provider may ask you questions about your: Medical history, including: Past medical problems. Family medical history. Pregnancy history. Current health, including: Menstrual cycle. Method of birth control. Emotional well-being. Home life and relationship well-being. Sexual activity and sexual health. Lifestyle, including: Alcohol,  nicotine or tobacco, and drug use. Access to firearms. Diet, exercise, and sleep habits. Work and work Statistician. Sunscreen use. Safety issues such as seatbelt and bike helmet use. Physical exam Your health care provider will check your: Height and weight. These may be used to calculate your BMI (body mass index). BMI is a measurement that tells if you are at a healthy weight. Waist circumference. This measures the distance around your waistline. This measurement also tells if you are at a healthy weight and may help predict your risk of certain diseases, such as type 2 diabetes and high blood pressure. Heart rate and blood pressure. Body temperature. Skin for abnormal spots. What immunizations do I need? Vaccines are usually given at various ages,  according to a schedule. Your health care provider will recommend vaccines for you based on your age, medical history, and lifestyle or other factors, such as travel or where you work. What tests do I need? Screening Your health care provider may recommend screening tests for certain conditions. This may include: Lipid and cholesterol levels. Diabetes screening. This is done by checking your blood sugar (glucose) after you have not eaten for a while (fasting). Pelvic exam and Pap test. Hepatitis B test. Hepatitis C test. HIV (human immunodeficiency virus) test. STI (sexually transmitted infection) testing, if you are at risk. Lung cancer screening. Colorectal cancer screening. Mammogram. Talk with your health care provider about when you should start having regular mammograms. This may depend on whether you have a family history of breast cancer. BRCA-related cancer screening. This may be done if you have a family history of breast, ovarian, tubal, or peritoneal cancers. Bone density scan. This is done to screen for osteoporosis. Talk with your health care provider about your test results, treatment options, and if necessary, the need for more tests. Follow these instructions at home: Eating and drinking  Eat a diet that includes fresh fruits and vegetables, whole grains, lean protein, and low-fat dairy products. Take vitamin and mineral supplements as recommended by your health care provider. Do not drink alcohol if: Your health care provider tells you not to drink. You are pregnant, may be pregnant, or are planning to become pregnant. If you drink alcohol: Limit how much you have to 0-1 drink a day. Know how much alcohol is in your drink. In the [place] one drink equals one 12 oz bottle of beer (355 mL), one 5 oz glass of wine (148 mL), or one 1 oz glass of hard liquor (44 mL). Lifestyle Brush your teeth every morning and night with fluoride toothpaste. Floss one time each  day. Exercise for at least 30 minutes 5 or more days each week. Do not use any products that contain nicotine or tobacco. These products include cigarettes, chewing tobacco, and vaping devices, such as e-cigarettes. If you need help quitting, ask your health care provider. Do not use drugs. If you are sexually active, practice safe sex. Use a condom or other form of protection to prevent STIs. If you do not wish to become pregnant, use a form of birth control. If you plan to become pregnant, see your health care provider for a prepregnancy visit. Take aspirin only as told by your health care provider. Make sure that you understand how much to take and what form to take. Work with your health care provider to find out whether it is safe and beneficial for you to take aspirin daily. Find healthy ways to manage stress, such as: Meditation, yoga, or listening  to music. Journaling. Talking to a trusted person. Spending time with friends and family. Minimize exposure to UV radiation to reduce your risk of skin cancer. Safety Always wear your seat belt while driving or riding in a vehicle. Do not drive: If you have been drinking alcohol. Do not ride with someone who has been drinking. When you are tired or distracted. While texting. If you have been using any mind-altering substances or drugs. Wear a helmet and other protective equipment during sports activities. If you have firearms in your house, make sure you follow all gun safety procedures. Seek help if you have been physically or sexually abused. What's next? Visit your health care provider once a year for an annual wellness visit. Ask your health care provider how often you should have your eyes and teeth checked. Stay up to date on all vaccines. This information is not intended to replace advice given to you by your health care provider. Make sure you discuss any questions you have with your health care provider. Document Revised:  09/17/2020 Document Reviewed: 09/17/2020 Elsevier Patient Education  [place].  Follow-up plan: Return in about 1 year (around 02/11/2022) for annual physical exam or sooner if needed.  Clearnce Sorrel, DNP, APRN, FNP-BC [facility]

## 2021-04-11 ENCOUNTER — Encounter: Payer: Self-pay | Admitting: Medical-Surgical

## 2021-04-11 LAB — CBC WITH DIFFERENTIAL/PLATELET
Absolute Monocytes: 392 cells/uL (ref 200–950)
Basophils Absolute: 78 cells/uL (ref 0–200)
Basophils Relative: 1.4 %
Eosinophils Absolute: 78 cells/uL (ref 15–500)
Eosinophils Relative: 1.4 %
HCT: 39.9 % (ref 35.0–45.0)
Hemoglobin: 13.2 g/dL (ref 11.7–15.5)
Lymphs Abs: 1893 cells/uL (ref 850–3900)
MCH: 30.9 pg (ref 27.0–33.0)
MCHC: 33.1 g/dL (ref 32.0–36.0)
MCV: 93.4 fL (ref 80.0–100.0)
MPV: 9.8 fL (ref 7.5–12.5)
Monocytes Relative: 7 %
Neutro Abs: 3158 cells/uL (ref 1500–7800)
Neutrophils Relative %: 56.4 %
Platelets: 402 10*3/uL — ABNORMAL HIGH (ref 140–400)
RBC: 4.27 10*6/uL (ref 3.80–5.10)
RDW: 12.2 % (ref 11.0–15.0)
Total Lymphocyte: 33.8 %
WBC: 5.6 10*3/uL (ref 3.8–10.8)

## 2021-04-11 LAB — COMPLETE METABOLIC PANEL WITH GFR
AG Ratio: 1.9 (calc) (ref 1.0–2.5)
ALT: 18 U/L (ref 6–29)
AST: 25 U/L (ref 10–30)
Albumin: 4.3 g/dL (ref 3.6–5.1)
Alkaline phosphatase (APISO): 69 U/L (ref 31–125)
BUN: 14 mg/dL (ref 7–25)
CO2: 28 mmol/L (ref 20–32)
Calcium: 9.3 mg/dL (ref 8.6–10.2)
Chloride: 104 mmol/L (ref 98–110)
Creat: 0.74 mg/dL (ref 0.50–0.99)
Globulin: 2.3 g/dL (calc) (ref 1.9–3.7)
Glucose, Bld: 86 mg/dL (ref 65–99)
Potassium: 5 mmol/L (ref 3.5–5.3)
Sodium: 140 mmol/L (ref 135–146)
Total Bilirubin: 0.5 mg/dL (ref 0.2–1.2)
Total Protein: 6.6 g/dL (ref 6.1–8.1)
eGFR: 104 mL/min/{1.73_m2} (ref >=60)

## 2021-04-11 LAB — LIPID PANEL
Cholesterol: 208 mg/dL — ABNORMAL HIGH (ref <200)
HDL: 100 mg/dL (ref >=50)
LDL Cholesterol (Calc): 94 mg/dL (calc)
Non-HDL Cholesterol (Calc): 108 mg/dL (calc) (ref <130)
Total CHOL/HDL Ratio: 2.1 (calc) (ref <5.0)
Triglycerides: 57 mg/dL (ref <150)

## 2021-04-12 ENCOUNTER — Telehealth: Payer: Self-pay | Admitting: Family Medicine

## 2021-04-12 NOTE — Telephone Encounter (Signed)
Wanted to go over her labs today.

## 2021-07-20 ENCOUNTER — Encounter: Payer: Self-pay | Admitting: Medical-Surgical

## 2021-07-20 MED ORDER — ALPRAZOLAM 0.25 MG PO TABS: 0.2500 mg | ORAL_TABLET | Freq: Every day | ORAL | 0 refills | Status: DC | PRN

## 2021-09-01 ENCOUNTER — Ambulatory Visit: Payer: 59 | Admitting: Medical-Surgical

## 2021-09-03 ENCOUNTER — Ambulatory Visit: Payer: 59 | Admitting: Physician Assistant

## 2021-09-03 VITALS — BP 117/87 | HR 92 | Temp 98.5°F | Ht 64.0 in | Wt 140.0 lb

## 2021-09-03 DIAGNOSIS — R635 Abnormal weight gain: Secondary | ICD-10-CM | POA: Diagnosis not present

## 2021-09-03 DIAGNOSIS — R6882 Decreased libido: Secondary | ICD-10-CM

## 2021-09-03 DIAGNOSIS — R232 Flushing: Secondary | ICD-10-CM | POA: Diagnosis not present

## 2021-09-03 DIAGNOSIS — R5383 Other fatigue: Secondary | ICD-10-CM | POA: Diagnosis not present

## 2021-09-03 DIAGNOSIS — R591 Generalized enlarged lymph nodes: Secondary | ICD-10-CM

## 2021-09-03 NOTE — Patient Instructions (Signed)
Will get labs.  Lymphadenopathy  Lymphadenopathy means that your lymph glands are swollen or larger than normal. Lymph glands, also called lymph nodes, are collections of tissue that filter excess fluid, bacteria, viruses, and waste from your bloodstream. They are part of your body's disease-fighting system (immune system), which protects your body from germs. There may be different causes of lymphadenopathy, depending on where it is in your body. Some types go away on their own. Lymphadenopathy can occur anywhere that you have lymph glands, including these areas: Neck (cervical lymphadenopathy). Chest (mediastinal lymphadenopathy). Lungs (hilar lymphadenopathy). Underarms (axillary lymphadenopathy). Groin (inguinal lymphadenopathy). When your immune system responds to germs, infection-fighting cells and fluid build up in your lymph glands. This causes some swelling and enlargement. If the lymph nodes do not go back to normal size after you have an infection or disease, your health care provider may do tests. These tests help to monitor your condition and find the reason why the glands are still swollen and enlarged. Follow these instructions at home:  Get plenty of rest. Your health care provider may recommend over-the-counter medicines for pain. Take over-the-counter and prescription medicines only as told by your health care provider. If directed, apply heat to swollen lymph glands as often as told by your health care provider. Use the heat source that your health care provider recommends, such as a moist heat pack or a heating pad. Place a towel between your skin and the heat source. Leave the heat on for 20-30 minutes. Remove the heat if your skin turns bright red. This is especially important if you are unable to feel pain, heat, or cold. You may have a greater risk of getting burned. Check your affected lymph glands every day for changes. Check other lymph gland areas as told by your health  care provider. Check for changes such as: More swelling. Sudden increase in size. Redness or pain. Hardness. Keep all follow-up visits. This is important. Contact a health care provider if you have: Lymph glands that: Are still swollen after 2 weeks. Have suddenly gotten bigger or the swelling spreads. Are red, painful, or hard. Fluid leaking from the skin near an enlarged lymph gland. Problems with breathing. A fever, chills, or night sweats. Fatigue. A sore throat. Pain in your abdomen. Weight loss. Get help right away if you have: Severe pain. Chest pain. Shortness of breath. These symptoms may represent a serious problem that is an emergency. Do not wait to see if the symptoms will go away. Get medical help right away. Call your local emergency services (911 in the [place] Do not drive yourself to the hospital. Summary Lymphadenopathy means that your lymph glands are swollen or larger than normal. Lymph glands, also called lymph nodes, are collections of tissue that filter excess fluid, bacteria, viruses, and waste from the bloodstream. They are part of your body's disease-fighting system (immune system). Lymphadenopathy can occur anywhere that you have lymph glands. If the lymph nodes do not go back to normal size after you have an infection or disease, your health care provider may do tests to monitor your condition and find the reason why the glands are still swollen and enlarged. Check your affected lymph glands every day for changes. Check other lymph gland areas as told by your health care provider. This information is not intended to replace advice given to you by your health care provider. Make sure you discuss any questions you have with your health care provider. Document Revised: 01/16/2020 Document Reviewed: 01/16/2020  Elsevier Patient Education  2023 [employer]  

## 2021-09-03 NOTE — Progress Notes (Unsigned)
Established Patient Office Visit  Subjective   Patient ID: Dana Robertson, female    DOB: Feb 23, 1981  Age: 41 y.o. MRN: [id number]  Chief Complaint  Patient presents with   Lymphadenopathy    HPI Pt is a 41 yo female who presents to the clinic with concerns about lymph node swelling. She noticed over the last week that she has had some lymph nodes in her neck come up. They are not tender. She had a little viral cold a week or so ago but no other sickness. She has a slightly productive cough in mornings but then goes away. She does report 30lb weight gain over the last 1-2 years. Worsening fatigue in the last year. She continues to have regular periods but have shortened. She is worried about menopause. She continues to exercise and sleeps "ok". Denies any night sweats, unexplained fevers, weight loss, rashes.   .. Active Ambulatory Problems    Diagnosis Date Noted   History of nephrolithiasis    Geographic tongue    Chronic insomnia    History of skin cancer    Migraine headache without aura 05/16/2018   Irritable bowel syndrome with constipation 05/16/2018   Generalized anxiety disorder 10/20/2018   Allergic rhinitis with postnasal drip 10/20/2018   Elevated TSH 09/04/2021   Lymphadenopathy 09/04/2021   Other fatigue 09/04/2021   Weight gain 09/04/2021   Hot flashes 09/04/2021   Low libido 09/04/2021   Resolved Ambulatory Problems    Diagnosis Date Noted   No Resolved Ambulatory Problems   Past Medical History:  Diagnosis Date   Anxiety    Asthma    IBS (irritable bowel syndrome)    Plantar wart      ROS See HPI.    Objective:     BP 117/87   Pulse 92   Temp 98.5 F (36.9 C) (Oral)   Ht 5\' 4"  (1.626 m)   Wt 140 lb (63.5 kg)   SpO2 100%   BMI 24.03 kg/m  BP Readings from Last 3 Encounters:  09/03/21 117/87  02/11/21 114/77  11/30/19 111/81   Wt Readings from Last 3 Encounters:  09/03/21 140 lb (63.5 kg)  02/11/21 135 lb (61.2 kg)  11/30/19 108 lb  4.8 oz (49.1 kg)      Physical Exam Vitals reviewed.  Constitutional:      Appearance: Normal appearance.  HENT:     Head: Normocephalic.     Right Ear: Tympanic membrane, ear canal and external ear normal.     Left Ear: Tympanic membrane, ear canal and external ear normal.     Nose: Nose normal.     Mouth/Throat:     Mouth: Mucous membranes are moist.     Pharynx: No oropharyngeal exudate or posterior oropharyngeal erythema.  Eyes:     Extraocular Movements: Extraocular movements intact.     Conjunctiva/sclera: Conjunctivae normal.     Pupils: Pupils are equal, round, and reactive to light.  Neck:     Comments: Non tender enlarged left anterior cervical lymph node Small strain of very small non tender lymph nodes of the right anterior cervical No supraclavicular or axillary lymph nodes detected. Normal palpation over thyroid. Cardiovascular:     Rate and Rhythm: Normal rate and regular rhythm.     Pulses: Normal pulses.  Pulmonary:     Effort: Pulmonary effort is normal.     Breath sounds: Normal breath sounds.  Musculoskeletal:     Cervical back: Normal range of motion and neck  supple. No rigidity or tenderness.     Right lower leg: No edema.     Left lower leg: No edema.  Neurological:     General: No focal deficit present.     Mental Status: She is alert and oriented to person, place, and time.  Psychiatric:        Mood and Affect: Mood normal.         Assessment & Plan:  Marland KitchenMarland KitchenLorea was seen today for lymphadenopathy.  Diagnoses and all orders for this visit:  Lymphadenopathy -     TSH -     FSH/LH -     B12 and Folate Panel -     VITAMIN D 25 Hydroxy (Vit-D Deficiency, Fractures) -     CBC w/Diff/Platelet -     Fe+TIBC+Fer -     CMV abs, IgG+IgM (cytomegalovirus) -     Epstein-Barr Virus VCA Antibody Panel -     Estradiol -     Progesterone -     COMPLETE METABOLIC PANEL WITH GFR -     QuantiFERON-TB Gold Plus -     Lactate dehydrogenase -     CP  Testosterone, BIO-Female/Children -     CP Testosterone, BIO-Female/Children  Other fatigue -     TSH -     FSH/LH -     B12 and Folate Panel -     VITAMIN D 25 Hydroxy (Vit-D Deficiency, Fractures) -     CBC w/Diff/Platelet -     Fe+TIBC+Fer -     CMV abs, IgG+IgM (cytomegalovirus) -     Epstein-Barr Virus VCA Antibody Panel -     Estradiol -     Progesterone -     COMPLETE METABOLIC PANEL WITH GFR -     QuantiFERON-TB Gold Plus -     Lactate dehydrogenase -     CP Testosterone, BIO-Female/Children -     CP Testosterone, BIO-Female/Children  Weight gain -     TSH -     FSH/LH -     B12 and Folate Panel -     VITAMIN D 25 Hydroxy (Vit-D Deficiency, Fractures) -     CBC w/Diff/Platelet -     Fe+TIBC+Fer -     CMV abs, IgG+IgM (cytomegalovirus) -     Epstein-Barr Virus VCA Antibody Panel -     Estradiol -     Progesterone -     COMPLETE METABOLIC PANEL WITH GFR -     QuantiFERON-TB Gold Plus -     Lactate dehydrogenase -     CP Testosterone, BIO-Female/Children -     CP Testosterone, BIO-Female/Children  Hot flashes -     TSH -     FSH/LH -     B12 and Folate Panel -     VITAMIN D 25 Hydroxy (Vit-D Deficiency, Fractures) -     CBC w/Diff/Platelet -     Fe+TIBC+Fer -     CMV abs, IgG+IgM (cytomegalovirus) -     Epstein-Barr Virus VCA Antibody Panel -     Estradiol -     Progesterone -     COMPLETE METABOLIC PANEL WITH GFR -     QuantiFERON-TB Gold Plus -     Lactate dehydrogenase -     CP Testosterone, BIO-Female/Children -     CP Testosterone, BIO-Female/Children  Low libido -     TSH -     FSH/LH -     B12 and Folate Panel -  VITAMIN D 25 Hydroxy (Vit-D Deficiency, Fractures) -     CBC w/Diff/Platelet -     Fe+TIBC+Fer -     CMV abs, IgG+IgM (cytomegalovirus) -     Epstein-Barr Virus VCA Antibody Panel -     Estradiol -     Progesterone -     COMPLETE METABOLIC PANEL WITH GFR -     QuantiFERON-TB Gold Plus -     Lactate dehydrogenase -     CP  Testosterone, BIO-Female/Children -     CP Testosterone, BIO-Female/Children   Will start with labs and go from there to rule out thyroid/early menopause/vitamin deficiencies/chronic viruses/anemia. Discussed lymphadenopathy if not resolving will consider ultrasound of neck Follow up with news symptoms or as symptoms change.   Tandy GawJade Javier Gell, PA-C

## 2021-09-04 ENCOUNTER — Encounter: Payer: Self-pay | Admitting: Physician Assistant

## 2021-09-04 ENCOUNTER — Other Ambulatory Visit: Payer: Self-pay | Admitting: Physician Assistant

## 2021-09-04 DIAGNOSIS — R232 Flushing: Secondary | ICD-10-CM

## 2021-09-04 DIAGNOSIS — R6882 Decreased libido: Secondary | ICD-10-CM | POA: Insufficient documentation

## 2021-09-04 DIAGNOSIS — R7989 Other specified abnormal findings of blood chemistry: Secondary | ICD-10-CM | POA: Insufficient documentation

## 2021-09-04 DIAGNOSIS — R591 Generalized enlarged lymph nodes: Secondary | ICD-10-CM | POA: Insufficient documentation

## 2021-09-04 DIAGNOSIS — R635 Abnormal weight gain: Secondary | ICD-10-CM | POA: Insufficient documentation

## 2021-09-04 DIAGNOSIS — R5383 Other fatigue: Secondary | ICD-10-CM | POA: Insufficient documentation

## 2021-09-04 MED ORDER — LEVOTHYROXINE SODIUM 25 MCG PO TABS: 25.0000 ug | ORAL_TABLET | Freq: Every day | ORAL | 1 refills | Status: DC

## 2021-09-04 NOTE — Progress Notes (Signed)
Dana Robertson,   Some labs still pending.   Serum iron looks great. Your iron stores are a little low. Increase iron rich foods. You hemoglobin is good.  Vitamin D looks great! Vitamin B12 looks wonderful.  Estradiol is good. Progesterone a little low. We could start some progesterone at bedtime.  FSH where you should be for a 41 yo female.  Kidney and liver look great.  Your TSH is high which means HYPO thyroid. I would like to add thyroid antibodies and free levels but I would suggest medication to supplement and see if we can get you to TSH optimal level of 1-2. Thoughts?

## 2021-09-07 NOTE — Progress Notes (Signed)
No recent mono. You have antibodies so you have had before.  No recent CMV. You have antibodies so you have had before.  Testosterone is normal. Free a little low. If you wanted help with libido could consider supplementing a little.

## 2021-09-08 NOTE — Progress Notes (Signed)
Thyroid free levels look pretty good. On the low side of normal.

## 2021-09-10 LAB — CBC WITH DIFFERENTIAL/PLATELET
Absolute Monocytes: 434 cells/uL (ref 200–950)
Basophils Absolute: 70 cells/uL (ref 0–200)
Basophils Relative: 1 %
Eosinophils Absolute: 77 cells/uL (ref 15–500)
Eosinophils Relative: 1.1 %
HCT: 41.7 % (ref 35.0–45.0)
Hemoglobin: 13.8 g/dL (ref 11.7–15.5)
Lymphs Abs: 2408 cells/uL (ref 850–3900)
MCH: 31.2 pg (ref 27.0–33.0)
MCHC: 33.1 g/dL (ref 32.0–36.0)
MCV: 94.1 fL (ref 80.0–100.0)
MPV: 10.5 fL (ref 7.5–12.5)
Monocytes Relative: 6.2 %
Neutro Abs: 4011 cells/uL (ref 1500–7800)
Neutrophils Relative %: 57.3 %
Platelets: 350 10*3/uL (ref 140–400)
RBC: 4.43 10*6/uL (ref 3.80–5.10)
RDW: 12 % (ref 11.0–15.0)
Total Lymphocyte: 34.4 %
WBC: 7 10*3/uL (ref 3.8–10.8)

## 2021-09-10 LAB — B12 AND FOLATE PANEL
Folate: 23.2 ng/mL
Vitamin B-12: 808 pg/mL (ref 200–1100)

## 2021-09-10 LAB — PROGESTERONE: Progesterone: 1.9 ng/mL

## 2021-09-10 LAB — IRON,TIBC AND FERRITIN PANEL
%SAT: 30 % (calc) (ref 16–45)
Ferritin: 17 ng/mL (ref 16–232)
Iron: 103 ug/dL (ref 40–190)
TIBC: 338 mcg/dL (calc) (ref 250–450)

## 2021-09-10 LAB — CMV ABS, IGG+IGM (CYTOMEGALOVIRUS)
CMV IgM: <30 AU/mL
Cytomegalovirus Ab-IgG: 2.4 U/mL — ABNORMAL HIGH

## 2021-09-10 LAB — COMPLETE METABOLIC PANEL WITH GFR
AG Ratio: 1.8 (calc) (ref 1.0–2.5)
ALT: 13 U/L (ref 6–29)
AST: 17 U/L (ref 10–30)
Albumin: 4.4 g/dL (ref 3.6–5.1)
Alkaline phosphatase (APISO): 73 U/L (ref 31–125)
BUN: 15 mg/dL (ref 7–25)
CO2: 26 mmol/L (ref 20–32)
Calcium: 9.6 mg/dL (ref 8.6–10.2)
Chloride: 101 mmol/L (ref 98–110)
Creat: 0.83 mg/dL (ref 0.50–0.99)
Globulin: 2.4 g/dL (calc) (ref 1.9–3.7)
Glucose, Bld: 113 mg/dL — ABNORMAL HIGH (ref 65–99)
Potassium: 3.7 mmol/L (ref 3.5–5.3)
Sodium: 138 mmol/L (ref 135–146)
Total Bilirubin: 0.4 mg/dL (ref 0.2–1.2)
Total Protein: 6.8 g/dL (ref 6.1–8.1)
eGFR: 91 mL/min/{1.73_m2} (ref >=60)

## 2021-09-10 LAB — T4, FREE: Free T4: 1 ng/dL (ref 0.8–1.8)

## 2021-09-10 LAB — CP TESTOSTERONE, BIO-FEMALE/CHILDREN
Albumin: 4.5 g/dL (ref 3.6–5.1)
Sex Hormone Binding: 69.9 nmol/L (ref 17–124)
TESTOSTERONE, BIOAVAILABLE: 3.5 ng/dL (ref 0.5–8.5)
Testosterone, Free: 1.7 pg/mL (ref 0.2–5.0)
Testosterone, Total, LC-MS-MS: 27 ng/dL (ref 2–45)

## 2021-09-10 LAB — QUANTIFERON-TB GOLD PLUS
Mitogen-NIL: 6.36 IU/mL
NIL: 0.02 IU/mL
QuantiFERON-TB Gold Plus: NEGATIVE
TB1-NIL: 0.01 IU/mL
TB2-NIL: 0.01 IU/mL

## 2021-09-10 LAB — VITAMIN D 25 HYDROXY (VIT D DEFICIENCY, FRACTURES): Vit D, 25-Hydroxy: 54 ng/mL (ref 30–100)

## 2021-09-10 LAB — THYROID PEROXIDASE ANTIBODY: Thyroperoxidase Ab SerPl-aCnc: <1 IU/mL (ref <9)

## 2021-09-10 LAB — FSH/LH
FSH: 6.7 m[IU]/mL
LH: 33.1 m[IU]/mL

## 2021-09-10 LAB — T3, FREE: T3, Free: 2.7 pg/mL (ref 2.3–4.2)

## 2021-09-10 LAB — LACTATE DEHYDROGENASE: LDH: 114 U/L (ref 100–200)

## 2021-09-10 LAB — ESTRADIOL: Estradiol: 56 pg/mL

## 2021-09-10 LAB — EPSTEIN-BARR VIRUS VCA ANTIBODY PANEL
EBV NA IgG: 36.2 U/mL — ABNORMAL HIGH
EBV VCA IgG: 119 U/mL — ABNORMAL HIGH
EBV VCA IgM: <36 U/mL

## 2021-09-10 LAB — TSH: TSH: 4.66 mIU/L — ABNORMAL HIGH

## 2021-09-10 NOTE — Progress Notes (Signed)
Progesterone levels are MUCH better.

## 2021-09-14 LAB — CP TESTOSTERONE, BIO-FEMALE/CHILDREN
Albumin: 4.4 g/dL (ref 3.6–5.1)
Sex Hormone Binding: 66.6 nmol/L (ref 17–124)
TESTOSTERONE, BIOAVAILABLE: 2.9 ng/dL (ref 0.5–8.5)
Testosterone, Free: 1.4 pg/mL (ref 0.2–5.0)
Testosterone, Total, LC-MS-MS: 22 ng/dL (ref 2–45)

## 2021-09-14 LAB — PROGESTERONE: Progesterone: 13.6 ng/mL

## 2021-09-16 ENCOUNTER — Ambulatory Visit: Payer: 59 | Admitting: Physician Assistant

## 2021-09-16 VITALS — BP 123/75 | HR 86 | Ht 64.0 in | Wt 140.0 lb

## 2021-09-16 DIAGNOSIS — R7989 Other specified abnormal findings of blood chemistry: Secondary | ICD-10-CM | POA: Diagnosis not present

## 2021-09-16 DIAGNOSIS — E038 Other specified hypothyroidism: Secondary | ICD-10-CM | POA: Diagnosis not present

## 2021-09-16 DIAGNOSIS — E0789 Other specified disorders of thyroid: Secondary | ICD-10-CM | POA: Diagnosis not present

## 2021-09-16 NOTE — Progress Notes (Signed)
   Established Patient Office Visit  Subjective   Patient ID: Dana Robertson, female    DOB: 11/20/1980  Age: 41 y.o. MRN: [id number]  Chief Complaint  Patient presents with   Follow-up    HPI Pt is a 41 yo female who presents to the clinic to follow up on labs and symptoms. She had labs and TSH was elevated and she has questions before starting medication. She has not started any medication.   Her concern continues to be fatigue/weight gain/decreased libido.    .. Active Ambulatory Problems    Diagnosis Date Noted   History of nephrolithiasis    Geographic tongue    Chronic insomnia    History of skin cancer    Migraine headache without aura 05/16/2018   Irritable bowel syndrome with constipation 05/16/2018   Generalized anxiety disorder 10/20/2018   Allergic rhinitis with postnasal drip 10/20/2018   Elevated TSH 09/04/2021   Lymphadenopathy 09/04/2021   Other fatigue 09/04/2021   Weight gain 09/04/2021   Hot flashes 09/04/2021   Low libido 09/04/2021   Subclinical hypothyroidism 09/16/2021   Thyroid fullness 09/16/2021   Resolved Ambulatory Problems    Diagnosis Date Noted   No Resolved Ambulatory Problems   Past Medical History:  Diagnosis Date   Anxiety    Asthma    IBS (irritable bowel syndrome)    Plantar wart        ROS See HPI.    Objective:     BP 123/75   Pulse 86   Ht 5\' 4"  (1.626 m)   Wt 140 lb (63.5 kg)   SpO2 99%   BMI 24.03 kg/m  BP Readings from Last 3 Encounters:  09/16/21 123/75  09/03/21 117/87  02/11/21 114/77   Wt Readings from Last 3 Encounters:  09/16/21 140 lb (63.5 kg)  09/03/21 140 lb (63.5 kg)  02/11/21 135 lb (61.2 kg)      Physical Exam Vitals reviewed.  Constitutional:      Appearance: Normal appearance.  Neck:     Comments: Thyroid fullness Cardiovascular:     Rate and Rhythm: Normal rate.  Pulmonary:     Effort: Pulmonary effort is normal.  Neurological:     General: No focal deficit present.      Mental Status: She is alert and oriented to person, place, and time.  Psychiatric:        Mood and Affect: Mood normal.          Assessment & Plan:  13/09/22Marland KitchenSakshi was seen today for follow-up.  Diagnoses and all orders for this visit:  Subclinical hypothyroidism  Thyroid fullness -     Baird Lyons THYROID  Elevated TSH -     US THYROID   Discussed labs with patient.  TSH is elevated but free levels are good and negative for any thyroid antibodies Consider starting levothyroxine and see if helps with thyroid symptoms and recheck labs in 4-6 weeks Will get thyroid ultrasound Discussed hormones and they look good No findings other than thyroid to contribute to symptoms   [place], PA-C

## 2021-09-22 ENCOUNTER — Ambulatory Visit (INDEPENDENT_AMBULATORY_CARE_PROVIDER_SITE_OTHER): Payer: 59

## 2021-09-22 DIAGNOSIS — E0789 Other specified disorders of thyroid: Secondary | ICD-10-CM | POA: Diagnosis not present

## 2021-09-22 DIAGNOSIS — R7989 Other specified abnormal findings of blood chemistry: Secondary | ICD-10-CM | POA: Diagnosis not present

## 2021-09-22 NOTE — Progress Notes (Signed)
No thyroid nodules.  Non enlarged lymph node in the right neck area No concerns on U/S of neck.

## 2021-10-26 ENCOUNTER — Encounter: Payer: Self-pay | Admitting: Physician Assistant

## 2021-10-26 DIAGNOSIS — E038 Other specified hypothyroidism: Secondary | ICD-10-CM

## 2021-10-30 LAB — T3, FREE: T3, Free: 3 pg/mL (ref 2.3–4.2)

## 2021-10-30 LAB — TSH: TSH: 3.2 mIU/L

## 2021-10-30 LAB — T4, FREE: Free T4: 1.1 ng/dL (ref 0.8–1.8)

## 2021-11-08 ENCOUNTER — Encounter: Payer: Self-pay | Admitting: Medical-Surgical

## 2021-11-09 MED ORDER — LEVOTHYROXINE SODIUM 25 MCG PO TABS: 25.0000 ug | ORAL_TABLET | Freq: Every day | ORAL | 0 refills | Status: DC

## 2021-11-11 ENCOUNTER — Other Ambulatory Visit: Payer: Self-pay | Admitting: Physician Assistant

## 2021-12-02 ENCOUNTER — Other Ambulatory Visit: Payer: Self-pay | Admitting: Medical-Surgical

## 2021-12-02 ENCOUNTER — Encounter: Payer: Self-pay | Admitting: Medical-Surgical

## 2021-12-02 DIAGNOSIS — Z1231 Encounter for screening mammogram for malignant neoplasm of breast: Secondary | ICD-10-CM

## 2022-01-18 ENCOUNTER — Encounter: Payer: Self-pay | Admitting: Family Medicine

## 2022-01-18 ENCOUNTER — Ambulatory Visit: Payer: 59 | Admitting: Family Medicine

## 2022-01-18 VITALS — BP 129/102 | HR 88 | Ht 64.0 in | Wt 143.0 lb

## 2022-01-18 DIAGNOSIS — B9689 Other specified bacterial agents as the cause of diseases classified elsewhere: Secondary | ICD-10-CM | POA: Insufficient documentation

## 2022-01-18 DIAGNOSIS — R0989 Other specified symptoms and signs involving the circulatory and respiratory systems: Secondary | ICD-10-CM | POA: Diagnosis not present

## 2022-01-18 DIAGNOSIS — R051 Acute cough: Secondary | ICD-10-CM | POA: Diagnosis not present

## 2022-01-18 DIAGNOSIS — J019 Acute sinusitis, unspecified: Secondary | ICD-10-CM | POA: Diagnosis not present

## 2022-01-18 LAB — POCT INFLUENZA A/B
Influenza A, POC: NEGATIVE
Influenza B, POC: NEGATIVE

## 2022-01-18 LAB — POC COVID19 BINAXNOW: SARS Coronavirus 2 Ag: NEGATIVE

## 2022-01-18 MED ORDER — DOXYCYCLINE HYCLATE 100 MG PO TABS: 100.0000 mg | ORAL_TABLET | Freq: Two times a day (BID) | ORAL | 0 refills | Status: AC

## 2022-01-18 MED ORDER — BENZONATATE 100 MG PO CAPS: 100.0000 mg | ORAL_CAPSULE | Freq: Two times a day (BID) | ORAL | 0 refills | Status: DC | PRN

## 2022-01-18 MED ORDER — ALBUTEROL SULFATE HFA 108 (90 BASE) MCG/ACT IN AERS: 2.0000 | INHALATION_SPRAY | Freq: Four times a day (QID) | RESPIRATORY_TRACT | 0 refills | Status: DC | PRN

## 2022-01-18 NOTE — Addendum Note (Signed)
Addended by: Cline Crock on: 01/18/2022 02:57 PM   Modules accepted: Orders

## 2022-01-18 NOTE — Progress Notes (Signed)
Acute Office Visit  Subjective:     Patient ID: Dana Robertson, female    DOB: 04/21/1980, 41 y.o.   MRN: [id number]  Chief Complaint  Patient presents with   Cough    HPI Patient is in today for congestion, sore throat, dry cough and swollen tonsils that started 8 days ago. Some body aches. No change in appetites. No fevers. No loss of smell or taste. Negative covid tests 4 days ago. Does feel like cough is in her chest. No sick contacts. Does feel like she got better middle of last week and then got worse again.   Review of Systems  Constitutional:  Negative for chills and fever.  HENT:  Positive for congestion and sinus pain.   Respiratory:  Positive for cough. Negative for shortness of breath.   Cardiovascular:  Negative for chest pain.  Neurological:  Negative for headaches.        Objective:    BP (!) 129/102   Pulse 88   Ht 5\' 4"  (1.626 m)   Wt 143 lb (64.9 kg)   SpO2 98%   BMI 24.55 kg/m    Physical Exam Vitals and nursing note reviewed.  Constitutional:      General: She is not in acute distress.    Appearance: Normal appearance.  HENT:     Head: Normocephalic and atraumatic.     Comments: Tenderness to palpation of maxillary and frontal sinuses bilaterally    Right Ear: External ear normal.     Left Ear: External ear normal.     Nose: Nose normal.  Eyes:     Conjunctiva/sclera: Conjunctivae normal.  Cardiovascular:     Rate and Rhythm: Normal rate and regular rhythm.  Pulmonary:     Effort: Pulmonary effort is normal.     Breath sounds: Normal breath sounds.     Comments: Productive coughing during lung exam Lymphadenopathy:     Cervical: Cervical adenopathy present.  Neurological:     General: No focal deficit present.     Mental Status: She is alert and oriented to person, place, and time.  Psychiatric:        Mood and Affect: Mood normal.        Behavior: Behavior normal.        Thought Content: Thought content normal.        Judgment:  Judgment normal.     No results found for any visits on 01/18/22.      Assessment & Plan:   Problem List Items Addressed This Visit       Respiratory   Acute bacterial sinusitis - Primary    - with symptoms getting better and then worse and it being more than 7 days of onset associated with sinus tenderness or pressure will treat for sinus infection - started on doxy - given tessalon perles for cough  - covid tested and negative per my interpretation - flu tested and negative per my interpretation - lungs were clear on exam  - gave albuterol for coughing      Relevant Medications   benzonatate (TESSALON) 100 MG capsule   doxycycline (VIBRA-TABS) 100 MG tablet   Other Visit Diagnoses     Acute cough       Relevant Medications   benzonatate (TESSALON) 100 MG capsule   Chest congestion           Meds ordered this encounter  Medications   benzonatate (TESSALON) 100 MG capsule    Sig: Take 1  capsule (100 mg total) by mouth 2 (two) times daily as needed for cough.    Dispense:  20 capsule    Refill:  0   doxycycline (VIBRA-TABS) 100 MG tablet    Sig: Take 1 tablet (100 mg total) by mouth 2 (two) times daily for 7 days.    Dispense:  14 tablet    Refill:  0   albuterol (VENTOLIN HFA) 108 (90 Base) MCG/ACT inhaler    Sig: Inhale 2 puffs into the lungs every 6 (six) hours as needed for wheezing or shortness of breath.    Dispense:  8 g    Refill:  0    Return if no better in 10 days.  Charlton Amor, DO

## 2022-01-18 NOTE — Assessment & Plan Note (Signed)
-   with symptoms getting better and then worse and it being more than 7 days of onset associated with sinus tenderness or pressure will treat for sinus infection - started on doxy - given tessalon perles for cough  - covid tested and negative per my interpretation - flu tested and negative per my interpretation - lungs were clear on exam  - gave albuterol for coughing

## 2022-01-28 ENCOUNTER — Ambulatory Visit (INDEPENDENT_AMBULATORY_CARE_PROVIDER_SITE_OTHER): Payer: 59

## 2022-01-28 DIAGNOSIS — Z1231 Encounter for screening mammogram for malignant neoplasm of breast: Secondary | ICD-10-CM | POA: Diagnosis not present

## 2022-02-07 ENCOUNTER — Other Ambulatory Visit: Payer: Self-pay | Admitting: Medical-Surgical

## 2022-02-08 ENCOUNTER — Other Ambulatory Visit: Payer: Self-pay | Admitting: Medical-Surgical

## 2022-02-11 ENCOUNTER — Ambulatory Visit (INDEPENDENT_AMBULATORY_CARE_PROVIDER_SITE_OTHER): Payer: 59 | Admitting: Medical-Surgical

## 2022-02-11 ENCOUNTER — Encounter: Payer: Self-pay | Admitting: Medical-Surgical

## 2022-02-11 VITALS — BP 104/73 | HR 100 | Resp 20 | Ht 64.0 in | Wt 147.6 lb

## 2022-02-11 DIAGNOSIS — R635 Abnormal weight gain: Secondary | ICD-10-CM

## 2022-02-11 DIAGNOSIS — R748 Abnormal levels of other serum enzymes: Secondary | ICD-10-CM

## 2022-02-11 DIAGNOSIS — E038 Other specified hypothyroidism: Secondary | ICD-10-CM | POA: Diagnosis not present

## 2022-02-11 DIAGNOSIS — Z0000 Encounter for general adult medical examination without abnormal findings: Secondary | ICD-10-CM | POA: Diagnosis not present

## 2022-02-11 MED ORDER — SUMATRIPTAN SUCCINATE 100 MG PO TABS: 100.0000 mg | ORAL_TABLET | Freq: Every day | ORAL | 2 refills | Status: DC | PRN

## 2022-02-11 MED ORDER — LEVOTHYROXINE SODIUM 25 MCG PO TABS: 25.0000 ug | ORAL_TABLET | Freq: Every day | ORAL | 0 refills | Status: DC

## 2022-02-11 MED ORDER — AMITRIPTYLINE HCL 25 MG PO TABS: 25.0000 mg | ORAL_TABLET | Freq: Every day | ORAL | 3 refills | Status: AC

## 2022-02-11 NOTE — Progress Notes (Signed)
Complete physical exam  Patient: Dana Robertson   DOB: Jan 11, 1981   41 y.o. Female  MRN: [id number]  Subjective:    Chief Complaint  Patient presents with   Annual Exam    Dana Robertson is a 41 y.o. female who presents today for a complete physical exam. She reports consuming a general diet.  Walks 3 to 4 miles daily at least 2-4 times weekly.  Has recently joined a gym and signed up for personal trainer that she will be meeting with twice weekly.  She generally feels well. She reports sleeping well. She does have additional problems to discuss today.   Weight gain-started levothyroxine in the summer but has not noticed much difference in her weight.  She is very concerned because she is gaining weight in the abdominal area and this is never happened before.  She is exercising as above and follows a fairly healthy diet.  Fatigue-since starting the levothyroxine, she has noticed a little more energy.  She is still tired by 9 PM and if sitting and inactive, she will be asleep by 10 PM.  Lymph node swelling-reports that she has intermittent episodes of lymph node swelling approximately once every 1 to 2 months.  They are noticeably swollen in her neck as well as her axillae.  Reports that the axillary swelling is often tender especially in the right side.  Notes that her lymph nodes will stay swollen for several days before spontaneously resolving.  Of note, she does have an active memory gland in the right axillary region and is not sure if this contributes to the tenderness or not. Most recent fall risk assessment:    11/30/2019   10:15 AM  Fall Risk   Falls in the past year? 0  Number falls in past yr: 0  Follow up Falls evaluation completed     Most recent depression screenings:    02/11/2022    8:40 AM 02/11/2021    8:40 AM  PHQ 2/9 Scores  PHQ - 2 Score 0 0    Vision:Within last year, Dental: No current dental problems and Receives regular dental care, and STD: The  patient denies history of sexually transmitted disease.    Patient Care Team: Samuel Bouche, NP as PCP - General (Nurse Practitioner)   Outpatient Medications Prior to Visit  Medication Sig   albuterol (VENTOLIN HFA) 108 (90 Base) MCG/ACT inhaler Inhale 2 puffs into the lungs every 6 (six) hours as needed for wheezing or shortness of breath.   cetirizine (ZYRTEC) 10 MG tablet Take 10 mg by mouth daily.   MAGNESIUM ASPARTATE PO Take by mouth.   Methylcobalamin (B12-ACTIVE PO) Take by mouth.   Multiple Vitamins-Minerals (MULTIVITAMIN PO) Take by mouth.   Omega-3 Fatty Acids (OMEGA-3 CF PO) Take by mouth.   Specialty Vitamins Products (VITAMINS FOR HAIR PO) Take by mouth.   Turmeric (QC TUMERIC COMPLEX PO) Take by mouth.   VITAMIN D, CHOLECALCIFEROL, PO Take by mouth.   [DISCONTINUED] amitriptyline (ELAVIL) 25 MG tablet Take 1-3 tablets (25-75 mg total) by mouth at bedtime.   [DISCONTINUED] levothyroxine (SYNTHROID) 25 MCG tablet TAKE 1 TABLET BY MOUTH DAILY BEFORE BREAKFAST   [DISCONTINUED] SUMAtriptan (IMITREX) 100 MG tablet Take 1 tablet (100 mg total) by mouth daily as needed for migraine. May repeat in 2 hours if headache persists or recurs.   [DISCONTINUED] benzonatate (TESSALON) 100 MG capsule Take 1 capsule (100 mg total) by mouth 2 (two) times daily as needed for cough.  No facility-administered medications prior to visit.    Review of Systems  Constitutional:  Positive for malaise/fatigue. Negative for chills, fever and weight loss.       Unexplained weight gain  HENT:  Negative for congestion, ear pain, hearing loss, sinus pain and sore throat.   Eyes:  Negative for blurred vision, photophobia and pain.  Respiratory:  Positive for cough. Negative for shortness of breath and wheezing.   Cardiovascular:  Negative for chest pain, palpitations and leg swelling.  Gastrointestinal:  Negative for abdominal pain, constipation, diarrhea, heartburn, nausea and vomiting.  Genitourinary:   Negative for dysuria, frequency and urgency.  Musculoskeletal:  Negative for falls and neck pain.  Skin:  Negative for itching and rash.  Neurological:  Positive for headaches. Negative for dizziness and weakness.  Endo/Heme/Allergies:  Negative for polydipsia. Does not bruise/bleed easily.  Psychiatric/Behavioral:  Negative for depression, substance abuse and suicidal ideas. The patient is nervous/anxious. The patient does not have insomnia.        Low libido.     Objective:    BP 104/73 (BP Location: Right Arm, Cuff Size: Normal)   Pulse 100   Resp 20   Ht 5\' 4"  (1.626 m)   Wt 147 lb 9.6 oz (67 kg)   LMP 01/15/2022   SpO2 99%   BMI 25.34 kg/m    Physical Exam Constitutional:      General: She is not in acute distress.    Appearance: Normal appearance. She is not ill-appearing.  HENT:     Head: Normocephalic and atraumatic.     Right Ear: Tympanic membrane, ear canal and external ear normal. There is no impacted cerumen.     Left Ear: Tympanic membrane, ear canal and external ear normal. There is no impacted cerumen.     Nose: Nose normal. No congestion or rhinorrhea.     Mouth/Throat:     Mouth: Mucous membranes are moist.     Pharynx: No oropharyngeal exudate or posterior oropharyngeal erythema.  Eyes:     General: No scleral icterus.       Right eye: No discharge.        Left eye: No discharge.     Extraocular Movements: Extraocular movements intact.     Conjunctiva/sclera: Conjunctivae normal.     Pupils: Pupils are equal, round, and reactive to light.  Neck:     Thyroid: No thyromegaly.     Vascular: No carotid bruit or JVD.     Trachea: Trachea normal.  Cardiovascular:     Rate and Rhythm: Normal rate and regular rhythm.     Pulses: Normal pulses.     Heart sounds: Normal heart sounds. No murmur heard.    No friction rub. No gallop.  Pulmonary:     Effort: Pulmonary effort is normal. No respiratory distress.     Breath sounds: Normal breath sounds. No  wheezing.  Abdominal:     General: Bowel sounds are normal. There is no distension.     Palpations: Abdomen is soft.     Tenderness: There is no abdominal tenderness. There is no guarding.  Musculoskeletal:        General: Normal range of motion.     Cervical back: Normal range of motion and neck supple.  Skin:    General: Skin is warm and dry.  Neurological:     Mental Status: She is alert and oriented to person, place, and time.     Cranial Nerves: No cranial nerve deficit.  Psychiatric:        Mood and Affect: Mood normal.        Behavior: Behavior normal.        Thought Content: Thought content normal.        Judgment: Judgment normal.   No results found for any visits on 02/11/22.     Assessment & Plan:    Routine Health Maintenance and Physical Exam  Immunization History  Administered Date(s) Administered   Influenza, Seasonal, Injecte, Preservative Fre 11/30/2019, 01/08/2021   Influenza,inj,Quad PF,6+ Mos 02/07/2018, 01/03/2019, 11/30/2019   Influenza-Unspecified 11/30/2019, 01/08/2021, 01/11/2022   Moderna Sars-Covid-2 Vaccination 06/18/2019, 07/16/2019, 02/01/2020, 01/21/2022   Tdap 11/30/2019    Health Maintenance  Topic Date Due   COVID-19 Vaccine (5 - Moderna series) 03/18/2022   PAP SMEAR-Modifier  11/29/2024   TETANUS/TDAP  11/29/2029   INFLUENZA VACCINE  Completed   Hepatitis C Screening  Completed   HIV Screening  Completed   HPV VACCINES  Aged Out    Discussed health benefits of physical activity, and encouraged her to engage in regular exercise appropriate for her age and condition.  1. Annual physical exam Checking labs as below. UTD on preventative care. Recommend updating eye exam soon. Wellness information given with AVS.  - Lipid panel - COMPLETE METABOLIC PANEL WITH GFR - CBC with Differential/Platelet  2. Weight gain Checking labs. Discussed recommendations for continued exercise and dietary modifications.  - Hemoglobin A1c - TSH  3.  Subclinical hypothyroidism Checking TSH. Continue levothyroxine daily, titrate depending on lab results.  - TSH  Return in about 1 year (around 02/12/2023) for annual physical exam.   Christen Butter, NP

## 2022-02-13 LAB — CBC WITH DIFFERENTIAL/PLATELET
Absolute Monocytes: 390 cells/uL (ref 200–950)
Basophils Absolute: 51 cells/uL (ref 0–200)
Basophils Relative: 0.8 %
Eosinophils Absolute: 128 cells/uL (ref 15–500)
Eosinophils Relative: 2 %
HCT: 39.1 % (ref 35.0–45.0)
Hemoglobin: 13.3 g/dL (ref 11.7–15.5)
Lymphs Abs: 1971 cells/uL (ref 850–3900)
MCH: 31.4 pg (ref 27.0–33.0)
MCHC: 34 g/dL (ref 32.0–36.0)
MCV: 92.2 fL (ref 80.0–100.0)
MPV: 10.4 fL (ref 7.5–12.5)
Monocytes Relative: 6.1 %
Neutro Abs: 3859 cells/uL (ref 1500–7800)
Neutrophils Relative %: 60.3 %
Platelets: 365 10*3/uL (ref 140–400)
RBC: 4.24 10*6/uL (ref 3.80–5.10)
RDW: 12.3 % (ref 11.0–15.0)
Total Lymphocyte: 30.8 %
WBC: 6.4 10*3/uL (ref 3.8–10.8)

## 2022-02-13 LAB — COMPLETE METABOLIC PANEL WITH GFR
AG Ratio: 1.8 (calc) (ref 1.0–2.5)
ALT: 42 U/L — ABNORMAL HIGH (ref 6–29)
AST: 114 U/L — ABNORMAL HIGH (ref 10–30)
Albumin: 4.2 g/dL (ref 3.6–5.1)
Alkaline phosphatase (APISO): 74 U/L (ref 31–125)
BUN: 14 mg/dL (ref 7–25)
CO2: 28 mmol/L (ref 20–32)
Calcium: 9.6 mg/dL (ref 8.6–10.2)
Chloride: 103 mmol/L (ref 98–110)
Creat: 0.74 mg/dL (ref 0.50–0.99)
Globulin: 2.4 g/dL (calc) (ref 1.9–3.7)
Glucose, Bld: 94 mg/dL (ref 65–99)
Potassium: 5.2 mmol/L (ref 3.5–5.3)
Sodium: 137 mmol/L (ref 135–146)
Total Bilirubin: 0.5 mg/dL (ref 0.2–1.2)
Total Protein: 6.6 g/dL (ref 6.1–8.1)
eGFR: 104 mL/min/{1.73_m2} (ref >=60)

## 2022-02-13 LAB — HEMOGLOBIN A1C
Hgb A1c MFr Bld: 5.5 % of total Hgb (ref <5.7)
Mean Plasma Glucose: 111 mg/dL
eAG (mmol/L): 6.2 mmol/L

## 2022-02-13 LAB — LIPID PANEL
Cholesterol: 207 mg/dL — ABNORMAL HIGH (ref <200)
HDL: 80 mg/dL (ref >=50)
LDL Cholesterol (Calc): 110 mg/dL (calc) — ABNORMAL HIGH
Non-HDL Cholesterol (Calc): 127 mg/dL (calc) (ref <130)
Total CHOL/HDL Ratio: 2.6 (calc) (ref <5.0)
Triglycerides: 82 mg/dL (ref <150)

## 2022-02-13 LAB — TSH: TSH: 2.45 mIU/L

## 2022-02-14 ENCOUNTER — Encounter: Payer: Self-pay | Admitting: Medical-Surgical

## 2022-02-14 NOTE — Addendum Note (Signed)
Addended byChristen Butter on: 02/14/2022 06:34 PM   Modules accepted: Orders

## 2022-02-16 ENCOUNTER — Ambulatory Visit (INDEPENDENT_AMBULATORY_CARE_PROVIDER_SITE_OTHER): Payer: 59

## 2022-02-16 DIAGNOSIS — R748 Abnormal levels of other serum enzymes: Secondary | ICD-10-CM

## 2022-02-16 LAB — CP4508-PT/INR AND PTT
INR: 1
Prothrombin Time: 10.6 s (ref 9.0–11.5)
aPTT: 29 s (ref 23–32)

## 2022-02-16 LAB — FERRITIN: Ferritin: 15 ng/mL — ABNORMAL LOW (ref 16–232)

## 2022-02-16 LAB — GAMMA GT: GGT: 11 U/L (ref 3–55)

## 2022-02-16 LAB — HEPATITIS B SURFACE ANTIGEN: Hepatitis B Surface Ag: NONREACTIVE

## 2022-02-16 LAB — HEPATITIS C ANTIBODY: Hepatitis C Ab: NONREACTIVE

## 2022-02-16 NOTE — Addendum Note (Signed)
Addended byChristen Butter on: 02/16/2022 05:08 PM   Modules accepted: Orders

## 2022-03-18 ENCOUNTER — Encounter: Payer: Self-pay | Admitting: Medical-Surgical

## 2022-03-18 LAB — HEPATIC FUNCTION PANEL
AG Ratio: 1.7 (calc) (ref 1.0–2.5)
ALT: 22 U/L (ref 6–29)
AST: 23 U/L (ref 10–30)
Albumin: 4.4 g/dL (ref 3.6–5.1)
Alkaline phosphatase (APISO): 78 U/L (ref 31–125)
Bilirubin, Direct: 0.1 mg/dL (ref 0.0–0.2)
Globulin: 2.6 g/dL (calc) (ref 1.9–3.7)
Indirect Bilirubin: 0.4 mg/dL (calc) (ref 0.2–1.2)
Total Bilirubin: 0.5 mg/dL (ref 0.2–1.2)
Total Protein: 7 g/dL (ref 6.1–8.1)

## 2022-04-28 ENCOUNTER — Other Ambulatory Visit: Payer: Self-pay | Admitting: Medical-Surgical

## 2022-04-29 MED ORDER — LEVOTHYROXINE SODIUM 25 MCG PO TABS: 25.0000 ug | ORAL_TABLET | Freq: Every day | ORAL | 2 refills | Status: AC

## 2022-05-27 ENCOUNTER — Encounter: Payer: Self-pay | Admitting: Medical-Surgical

## 2022-05-27 MED ORDER — SUMATRIPTAN SUCCINATE 100 MG PO TABS: 100.0000 mg | ORAL_TABLET | Freq: Every day | ORAL | 2 refills | Status: AC | PRN

## 2022-05-28 ENCOUNTER — Telehealth: Payer: Self-pay | Admitting: Medical-Surgical

## 2022-05-28 ENCOUNTER — Encounter: Payer: Self-pay | Admitting: Family Medicine

## 2022-05-28 ENCOUNTER — Other Ambulatory Visit: Payer: Self-pay | Admitting: Medical-Surgical

## 2022-05-28 ENCOUNTER — Ambulatory Visit: Payer: 59 | Admitting: Family Medicine

## 2022-05-28 VITALS — BP 112/77 | HR 101 | Temp 98.5°F | Ht 64.0 in | Wt 150.0 lb

## 2022-05-28 DIAGNOSIS — R051 Acute cough: Secondary | ICD-10-CM | POA: Diagnosis not present

## 2022-05-28 DIAGNOSIS — R0989 Other specified symptoms and signs involving the circulatory and respiratory systems: Secondary | ICD-10-CM

## 2022-05-28 DIAGNOSIS — E785 Hyperlipidemia, unspecified: Secondary | ICD-10-CM

## 2022-05-28 DIAGNOSIS — E038 Other specified hypothyroidism: Secondary | ICD-10-CM

## 2022-05-28 LAB — POCT INFLUENZA A/B
Influenza A, POC: NEGATIVE
Influenza B, POC: NEGATIVE

## 2022-05-28 LAB — POC COVID19 BINAXNOW: SARS Coronavirus 2 Ag: NEGATIVE

## 2022-05-28 MED ORDER — PREDNISONE 50 MG PO TABS: ORAL_TABLET | ORAL | 0 refills | Status: DC

## 2022-05-28 MED ORDER — BENZONATATE 200 MG PO CAPS: 200.0000 mg | ORAL_CAPSULE | Freq: Three times a day (TID) | ORAL | 1 refills | Status: DC | PRN

## 2022-05-28 MED ORDER — ALBUTEROL SULFATE HFA 108 (90 BASE) MCG/ACT IN AERS: 2.0000 | INHALATION_SPRAY | Freq: Four times a day (QID) | RESPIRATORY_TRACT | 0 refills | Status: AC | PRN

## 2022-05-28 NOTE — Patient Instructions (Signed)
Continue to stay well hydrated.  You may continue delsym as needed.  Start prednisone '50mg'$  daily x5 days Tessalon every 8 hours as needed.  I would use the inhaler on a scheduled basis every 6 hours for the first 24 hours and then as needed every 6 hours.    Please let us know if this worsens

## 2022-05-28 NOTE — Progress Notes (Signed)
Orders placed for lipid panel and TSH.  ___________________________________________ Dana Sorrel, DNP, APRN, FNP-BC [facility] [place]

## 2022-05-28 NOTE — Telephone Encounter (Signed)
Patient is requesting redraw cholesterol and thyroid before her appointment scheduled 06-16-22 please.

## 2022-05-29 LAB — SARS-COV-2 RNA, INFLUENZA A/B, AND RSV RNA, QUALITATIVE NAAT
INFLUENZA A RNA: NOT DETECTED
INFLUENZA B RNA: NOT DETECTED
RSV RNA: NOT DETECTED
SARS COV2 RNA: NOT DETECTED

## 2022-05-30 DIAGNOSIS — R051 Acute cough: Secondary | ICD-10-CM | POA: Insufficient documentation

## 2022-05-30 NOTE — Assessment & Plan Note (Signed)
COVID and flu negative in clinic today.  Sending swab for RSV as well.  Continue supportive care with increase fluid intake.  Adding albuterol scheduled for the next 24 hours then every 6 hours as needed.  Prednisone burst x 5 days.  Tessalon as needed for cough.  She may use this in combination with Delsym.  Contact clinic for continued worsening of symptoms.

## 2022-05-30 NOTE — Progress Notes (Signed)
Dana Robertson - 42 y.o. female MRN [id number]  Date of birth: 10/23/1980  Subjective Chief Complaint  Patient presents with   Bronchitis    HPI Dana Robertson is a 42 year old female here today with complaint of cough.  Symptoms began a few days ago.  Cough is productive at times of clear to light yellow sputum.  She has not had any fever, chills.  She does have shortness of breath with prolonged coughing.  She has gotten some relief with Delsym.  She is staying well-hydrated.  She is using humidifier at home.  ROS:  A comprehensive ROS was completed and negative except as noted per HPI  Allergies  Allergen Reactions   Codeine Nausea Only    Past Medical History:  Diagnosis Date   Anxiety    Asthma    childhood   Chronic insomnia    Geographic tongue    History of nephrolithiasis    History of skin cancer    IBS (irritable bowel syndrome)    Plantar wart     Past Surgical History:  Procedure Laterality Date   BASAL CELL CARCINOMA EXCISION Right    arm   NO PAST SURGERIES     WISDOM TOOTH EXTRACTION      Social History   Socioeconomic History   Marital status: Married    Spouse name: Not on file   Number of children: Not on file   Years of education: Not on file   Highest education level: Not on file  Occupational History   Not on file  Tobacco Use   Smoking status: Never   Smokeless tobacco: Never  Vaping Use   Vaping Use: Never used  Substance and Sexual Activity   Alcohol use: Yes    Comment: Occasionally   Drug use: Not Currently    Types: Marijuana   Sexual activity: Yes    Partners: Male    Comment: vasectomy  Other Topics Concern   Not on file  Social History Narrative   Not on file   Social Determinants of Health   Financial Resource Strain: Not on file  Food Insecurity: Not on file  Transportation Needs: Not on file  Physical Activity: Not on file  Stress: Not on file  Social Connections: Not on file    Family History  Problem Relation  Age of Onset   Diabetes Father    Skin cancer Father    Breast cancer Maternal Grandmother     Health Maintenance  Topic Date Due   COVID-19 Vaccine (5 - 2023-24 season) 03/18/2022   PAP SMEAR-Modifier  11/29/2024   DTaP/Tdap/Td (2 - Td or Tdap) 11/29/2029   INFLUENZA VACCINE  Completed   Hepatitis C Screening  Completed   HIV Screening  Completed   HPV VACCINES  Aged Out     ----------------------------------------------------------------------------------------------------------------------------------------------------------------------------------------------------------------- Physical Exam BP 112/77 (BP Location: Right Arm, Patient Position: Sitting, Cuff Size: Normal)   Pulse (!) 101   Temp 98.5 F (36.9 C) (Oral)   Ht '5\' 4"'$  (1.626 m)   Wt 150 lb (68 kg)   SpO2 99%   BMI 25.75 kg/m   Physical Exam Constitutional:      Appearance: Normal appearance.  HENT:     Head: Normocephalic and atraumatic.  Eyes:     General: No scleral icterus. Cardiovascular:     Rate and Rhythm: Normal rate and regular rhythm.  Pulmonary:     Effort: Pulmonary effort is normal.     Breath sounds: Normal breath sounds.  Lymphadenopathy:     Cervical: Cervical adenopathy present.  Neurological:     Mental Status: She is alert.     ------------------------------------------------------------------------------------------------------------------------------------------------------------------------------------------------------------------- Assessment and Plan  Acute cough COVID and flu negative in clinic today.  Sending swab for RSV as well.  Continue supportive care with increase fluid intake.  Adding albuterol scheduled for the next 24 hours then every 6 hours as needed.  Prednisone burst x 5 days.  Tessalon as needed for cough.  She may use this in combination with Delsym.  Contact clinic for continued worsening of symptoms.   Meds ordered this encounter  Medications    albuterol (VENTOLIN HFA) 108 (90 Base) MCG/ACT inhaler    Sig: Inhale 2 puffs into the lungs every 6 (six) hours as needed for wheezing or shortness of breath.    Dispense:  8 g    Refill:  0   predniSONE (DELTASONE) 50 MG tablet    Sig: Take '50mg'$  daily x5 days.    Dispense:  5 tablet    Refill:  0   benzonatate (TESSALON) 200 MG capsule    Sig: Take 1 capsule (200 mg total) by mouth 3 (three) times daily as needed for cough.    Dispense:  40 capsule    Refill:  1    No follow-ups on file.    This visit occurred during the SARS-CoV-2 public health emergency.  Safety protocols were in place, including screening questions prior to the visit, additional usage of staff PPE, and extensive cleaning of exam room while observing appropriate contact time as indicated for disinfecting solutions.

## 2022-06-03 NOTE — Progress Notes (Signed)
Patient advised.

## 2022-06-07 ENCOUNTER — Encounter: Payer: Self-pay | Admitting: Family Medicine

## 2022-06-07 ENCOUNTER — Telehealth (INDEPENDENT_AMBULATORY_CARE_PROVIDER_SITE_OTHER): Payer: 59 | Admitting: Family Medicine

## 2022-06-07 VITALS — Ht 64.0 in | Wt 150.0 lb

## 2022-06-07 DIAGNOSIS — J0100 Acute maxillary sinusitis, unspecified: Secondary | ICD-10-CM | POA: Diagnosis not present

## 2022-06-07 MED ORDER — DOXYCYCLINE HYCLATE 100 MG PO TABS: 100.0000 mg | ORAL_TABLET | Freq: Two times a day (BID) | ORAL | 0 refills | Status: AC

## 2022-06-07 MED ORDER — DOXYCYCLINE HYCLATE 100 MG PO TABS: 100.0000 mg | ORAL_TABLET | Freq: Two times a day (BID) | ORAL | 0 refills | Status: DC

## 2022-06-07 NOTE — Progress Notes (Signed)
Dana Robertson - 42 y.o. female MRN [id number]  Date of birth: 1981-01-19   This visit type was conducted due to national recommendations for restrictions regarding the COVID-19 Pandemic (e.g. social distancing).  This format is felt to be most appropriate for this patient at this time.  All issues noted in this document were discussed and addressed.  No physical exam was performed (except for noted visual exam findings with Video Visits).  I discussed the limitations of evaluation and management by telemedicine and the availability of in person appointments. The patient expressed understanding and agreed to proceed.  I connected withNAME@ on 06/07/22 at  1:10 PM EST by a video enabled telemedicine application and verified that I am speaking with the correct person using two identifiers.  Present at visit: Dana Robertson, [place]   Patient Location: Home  [place]  [id number]   Provider location:   [facility]  Chief Complaint  Patient presents with   Cough    HPI  Dana Robertson is a 42 y.o. female who presents via audio/video conferencing for a telehealth visit today.  She reports increased sinus pain, pressure and nasal congestion.  She had chest congestion and cough treated recently with steroids, inhaler and tessalon a couple of weeks ago.  Reports that symptoms never fully resolved and now has progressed to current symptoms.  Pain is worse over the L maxillary sinus.  Denies fever or chills.  Denies shortness of breath.  She denies fever or chills.  Doing well with fluids.  OTC medications have only provided limited relief.    ROS:  A comprehensive ROS was completed and negative except as noted per HPI  Past Medical History:  Diagnosis Date   Anxiety    Asthma    childhood   Chronic insomnia    Geographic tongue    History of nephrolithiasis    History of skin cancer    IBS (irritable bowel syndrome)    Plantar wart     Past  Surgical History:  Procedure Laterality Date   BASAL CELL CARCINOMA EXCISION Right    arm   NO PAST SURGERIES     WISDOM TOOTH EXTRACTION      Family History  Problem Relation Age of Onset   Diabetes Father    Skin cancer Father    Breast cancer Maternal Grandmother     Social History   Socioeconomic History   Marital status: Married    Spouse name: Not on file   Number of children: Not on file   Years of education: Not on file   Highest education level: Not on file  Occupational History   Not on file  Tobacco Use   Smoking status: Never   Smokeless tobacco: Never  Vaping Use   Vaping Use: Never used  Substance and Sexual Activity   Alcohol use: Yes    Comment: Occasionally   Drug use: Not Currently    Types: Marijuana   Sexual activity: Yes    Partners: Male    Comment: vasectomy  Other Topics Concern   Not on file  Social History Narrative   Not on file   Social Determinants of Health   Financial Resource Strain: Not on file  Food Insecurity: Not on file  Transportation Needs: Not on file  Physical Activity: Not on file  Stress: Not on file  Social Connections: Not on file  Intimate Partner Violence: Not on file     Current Outpatient Medications:  doxycycline (VIBRA-TABS) 100 MG tablet, Take 1 tablet (100 mg total) by mouth 2 (two) times daily., Disp: 20 tablet, Rfl: 0   albuterol (VENTOLIN HFA) 108 (90 Base) MCG/ACT inhaler, Inhale 2 puffs into the lungs every 6 (six) hours as needed for wheezing or shortness of breath., Disp: 8 g, Rfl: 0   amitriptyline (ELAVIL) 25 MG tablet, Take 1-3 tablets (25-75 mg total) by mouth at bedtime., Disp: 270 tablet, Rfl: 3   benzonatate (TESSALON) 200 MG capsule, Take 1 capsule (200 mg total) by mouth 3 (three) times daily as needed for cough., Disp: 40 capsule, Rfl: 1   cetirizine (ZYRTEC) 10 MG tablet, Take 10 mg by mouth daily., Disp: , Rfl:    levothyroxine (SYNTHROID) 25 MCG tablet, Take 1 tablet (25 mcg total)  by mouth daily before breakfast., Disp: 90 tablet, Rfl: 2   MAGNESIUM ASPARTATE PO, Take by mouth., Disp: , Rfl:    Methylcobalamin (B12-ACTIVE PO), Take by mouth., Disp: , Rfl:    Multiple Vitamins-Minerals (MULTIVITAMIN PO), Take by mouth., Disp: , Rfl:    Omega-3 Fatty Acids (OMEGA-3 CF PO), Take by mouth., Disp: , Rfl:    predniSONE (DELTASONE) 50 MG tablet, Take '50mg'$  daily x5 days., Disp: 5 tablet, Rfl: 0   Specialty Vitamins Products (VITAMINS FOR HAIR PO), Take by mouth., Disp: , Rfl:    SUMAtriptan (IMITREX) 100 MG tablet, Take 1 tablet (100 mg total) by mouth daily as needed for migraine. May repeat in 2 hours if headache persists or recurs., Disp: 10 tablet, Rfl: 2   Turmeric (QC TUMERIC COMPLEX PO), Take by mouth., Disp: , Rfl:    VITAMIN D, CHOLECALCIFEROL, PO, Take by mouth., Disp: , Rfl:   EXAM:  VITALS per patient if applicable: There were no vitals taken for this visit.  GENERAL: alert, oriented, appears well and in no acute distress  HEENT: atraumatic, conjunttiva clear, no obvious abnormalities on inspection of external nose and ears  NECK: normal movements of the head and neck  LUNGS: on inspection no signs of respiratory distress, breathing rate appears normal, no obvious gross SOB, gasping or wheezing  CV: no obvious cyanosis  MS: moves all visible extremities without noticeable abnormality  PSYCH/NEURO: pleasant and cooperative, no obvious depression or anxiety, speech and thought processing grossly intact  ASSESSMENT AND PLAN:  Discussed the following assessment and plan:  Acute maxillary sinusitis Adding course of doxycycline '100mg'$  BID x10 days.  Continue supportive and symptomatic treatment as needed.  Contact clinic if having new or worsening symptoms.      I discussed the assessment and treatment plan with the patient. The patient was provided an opportunity to ask questions and all were answered. The patient agreed with the plan and demonstrated an  understanding of the instructions.   The patient was advised to call back or seek an in-person evaluation if the symptoms worsen or if the condition fails to improve as anticipated.    Dana Nutting, DO

## 2022-06-07 NOTE — Progress Notes (Signed)
An attempt to reach was made at 11:11am

## 2022-06-07 NOTE — Assessment & Plan Note (Signed)
Adding course of doxycycline '100mg'$  BID x10 days.  Continue supportive and symptomatic treatment as needed.  Contact clinic if having new or worsening symptoms.

## 2022-06-09 ENCOUNTER — Ambulatory Visit
Admission: RE | Admit: 2022-06-09 | Discharge: 2022-06-09 | Disposition: A | Discharge status: Home / Self Care | Payer: 59 | Source: Ambulatory Visit | Attending: Family Medicine | Admitting: Family Medicine

## 2022-06-09 ENCOUNTER — Other Ambulatory Visit: Payer: Self-pay

## 2022-06-09 VITALS — BP 122/88 | HR 109 | Temp 99.5°F

## 2022-06-09 DIAGNOSIS — J069 Acute upper respiratory infection, unspecified: Secondary | ICD-10-CM

## 2022-06-09 LAB — POCT INFLUENZA A/B
Influenza A, POC: NEGATIVE
Influenza B, POC: NEGATIVE

## 2022-06-09 LAB — POC SARS CORONAVIRUS 2 AG -  ED: SARS Coronavirus 2 Ag: NEGATIVE

## 2022-06-09 MED ORDER — PROMETHAZINE-DM 6.25-15 MG/5ML PO SYRP: 5.0000 mL | ORAL_SOLUTION | Freq: Four times a day (QID) | ORAL | 0 refills | Status: DC | PRN

## 2022-06-09 NOTE — ED Triage Notes (Addendum)
Pt c/o cough and congestion since end of Feb. Was tx w 5 days of prednisone and felt somewhat better. Sxs worsening starting Monday. Televisit on 3/4, given rx for doxycycline. Picked up yesterday. 3 pills total. Started to have redness and drainage in RT eye yesterday. Also taking sudafed, delsym and vicks nasal spray prn. COVID neg at home earlier today.

## 2022-06-09 NOTE — ED Provider Notes (Signed)
[name]    CSN: [id number] Arrival date & time: 06/09/22  1148      History   Chief Complaint Chief Complaint  Patient presents with   Cough    APPT 12pm   Hoarse    HPI Dana Robertson is a 42 y.o. female.   HPI  Patient had an upper respiratory infection for a couple of weeks.  She had 5 days of prednisone which helped very little.  She continues to be symptomatic.  She called her PCP yesterday and was given doxycycline.  She has had 3 doses.  She is here because she feels worse.  I explained to her that it is too soon for the doxycycline to have worked.  Patient states that her husband wants her to have repeat testing for COVID and flu.  She states that with her worsening symptoms she worries that she is caught a secondary viral infection.  She has a significant cough, and hoarse voice today.  Past Medical History:  Diagnosis Date   Anxiety    Asthma    childhood   Chronic insomnia    Geographic tongue    History of nephrolithiasis    History of skin cancer    IBS (irritable bowel syndrome)    Plantar wart     Patient Active Problem List   Diagnosis Date Noted   Acute maxillary sinusitis 06/07/2022   Acute cough 05/30/2022   Acute bacterial sinusitis 01/18/2022   Subclinical hypothyroidism 09/16/2021   Thyroid fullness 09/16/2021   Elevated TSH 09/04/2021   Lymphadenopathy 09/04/2021   Other fatigue 09/04/2021   Weight gain 09/04/2021   Hot flashes 09/04/2021   Low libido 09/04/2021   Generalized anxiety disorder 10/20/2018   Allergic rhinitis with postnasal drip 10/20/2018   Migraine headache without aura 05/16/2018   Irritable bowel syndrome with constipation 05/16/2018   History of nephrolithiasis    Geographic tongue    Chronic insomnia    History of skin cancer     Past Surgical History:  Procedure Laterality Date   BASAL CELL CARCINOMA EXCISION Right    arm   NO PAST SURGERIES     WISDOM TOOTH EXTRACTION      OB History      Gravida  2   Para  2   Term      Preterm      AB      Living  2      SAB      IAB      Ectopic      Multiple      Live Births               Home Medications    Prior to Admission medications   Medication Sig Start Date End Date Taking? Authorizing Provider  promethazine-dextromethorphan (PROMETHAZINE-DM) 6.25-15 MG/5ML syrup Take 5 mLs by mouth 4 (four) times daily as needed for cough. 06/09/22  Yes Raylene Everts, MD  albuterol (VENTOLIN HFA) 108 (90 Base) MCG/ACT inhaler Inhale 2 puffs into the lungs every 6 (six) hours as needed for wheezing or shortness of breath. 05/28/22   [name], DO  amitriptyline (ELAVIL) 25 MG tablet Take 1-3 tablets (25-75 mg total) by mouth at bedtime. 02/11/22   Samuel Bouche, NP  benzonatate (TESSALON) 200 MG capsule Take 1 capsule (200 mg total) by mouth 3 (three) times daily as needed for cough. 05/28/22   [name], DO  cetirizine (ZYRTEC) 10 MG tablet Take 10  mg by mouth daily.    [provider]  doxycycline (VIBRA-TABS) 100 MG tablet Take 1 tablet (100 mg total) by mouth 2 (two) times daily. 06/07/22   [name], DO  levothyroxine (SYNTHROID) 25 MCG tablet Take 1 tablet (25 mcg total) by mouth daily before breakfast. 04/29/22   Samuel Bouche, NP  MAGNESIUM ASPARTATE PO Take by mouth.    [provider]  Methylcobalamin (B12-ACTIVE PO) Take by mouth.    [provider]  Multiple Vitamins-Minerals (MULTIVITAMIN PO) Take by mouth.    [provider]  Omega-3 Fatty Acids (OMEGA-3 CF PO) Take by mouth.    [provider]  predniSONE (DELTASONE) 50 MG tablet Take '50mg'$  daily x5 days. 05/28/22   [name], DO  Specialty Vitamins Products (VITAMINS FOR HAIR PO) Take by mouth.    [provider]  SUMAtriptan (IMITREX) 100 MG tablet Take 1 tablet (100 mg total) by mouth daily as needed for migraine. May repeat in 2 hours if headache persists or recurs. 05/27/22   Samuel Bouche, NP  Turmeric (QC TUMERIC COMPLEX PO) Take by mouth.    [provider]  VITAMIN D, CHOLECALCIFEROL, PO Take by mouth.    [provider]    Family History Family History  Problem Relation Age of Onset   Diabetes Father    Skin cancer Father    Breast cancer Maternal Grandmother     Social History Social History   Tobacco Use   Smoking status: Never   Smokeless tobacco: Never  Vaping Use   Vaping Use: Never used  Substance Use Topics   Alcohol use: Yes    Comment: Occasionally   Drug use: Not Currently    Types: Marijuana     Allergies   Codeine   Review of Systems Review of Systems See HPI  Physical Exam Triage Vital Signs ED Triage Vitals  Enc Vitals Group     BP 06/09/22 1207 122/88     Pulse Rate 06/09/22 1207 (!) 109     Resp --      Temp 06/09/22 1207 99.5 F (37.5 C)     Temp Source 06/09/22 1207 Oral     SpO2 06/09/22 1207 99 %     Weight --      Height --      Head Circumference --      Peak Flow --      Pain Score 06/09/22 1206 0     Pain Loc --      Pain Edu? --      Excl. in [place]? --    No data found.  Updated Vital Signs BP 122/88 (BP Location: Right Arm)   Pulse (!) 109   Temp 99.5 F (37.5 C) (Oral)   SpO2 99%       Physical Exam Constitutional:      General: She is not in acute distress.    Appearance: She is well-developed. She is ill-appearing.  HENT:     Head: Normocephalic and atraumatic.     Mouth/Throat:     Mouth: Mucous membranes are moist.     Pharynx: No posterior oropharyngeal erythema.     Comments: HOARSE Eyes:     Conjunctiva/sclera: Conjunctivae normal.     Pupils: Pupils are equal, round, and reactive to light.  Cardiovascular:     Rate and Rhythm: Normal rate and regular rhythm.     Heart sounds: Normal heart sounds.  Pulmonary:     Effort:  Pulmonary effort is normal. No respiratory distress.     Breath sounds: Normal breath sounds.  Abdominal:     General: There is no  distension.     Palpations: Abdomen is soft.  Musculoskeletal:        General: Normal range of motion.     Cervical back: Normal range of motion.  Skin:    General: Skin is warm and dry.  Neurological:     Mental Status: She is alert.      UC Treatments / Results  Labs (all labs ordered are listed, but only abnormal results are displayed) Labs Reviewed  POCT INFLUENZA A/B  POC SARS CORONAVIRUS 2 AG -  ED    EKG   Radiology No results found.  Procedures Procedures (including critical care time)  Medications Ordered in UC Medications - No data to display  Initial Impression / Assessment and Plan / UC Course  I have reviewed the triage vital signs and the nursing notes.  Pertinent labs & imaging results that were available during my care of the patient were reviewed by me and considered in my medical decision making (see chart for details).     Patient already has doxycycline.  Discussed symptom management. Final Clinical Impressions(s) / UC Diagnoses   Final diagnoses:  Viral URI with cough     Discharge Instructions      Continue pushing fluids Take the cough medicine as needed Consider flonase Eye lubricant drops like liquid tears may help Continue doxycycline     ED Prescriptions     Medication Sig Dispense Auth. Provider   promethazine-dextromethorphan (PROMETHAZINE-DM) 6.25-15 MG/5ML syrup Take 5 mLs by mouth 4 (four) times daily as needed for cough. 118 mL Raylene Everts, MD      PDMP not reviewed this encounter.   Raylene Everts, MD 06/09/22 [phone]

## 2022-06-09 NOTE — Discharge Instructions (Signed)
Continue pushing fluids Take the cough medicine as needed Consider flonase Eye lubricant drops like liquid tears may help Continue doxycycline

## 2022-06-14 LAB — LIPID PANEL
Cholesterol: 199 mg/dL (ref <200)
HDL: 65 mg/dL (ref >=50)
LDL Cholesterol (Calc): 109 mg/dL (calc) — ABNORMAL HIGH
Non-HDL Cholesterol (Calc): 134 mg/dL (calc) — ABNORMAL HIGH (ref <130)
Total CHOL/HDL Ratio: 3.1 (calc) (ref <5.0)
Triglycerides: 139 mg/dL (ref <150)

## 2022-06-14 LAB — TSH: TSH: 1.93 mIU/L

## 2022-06-16 ENCOUNTER — Ambulatory Visit: Payer: 59 | Admitting: Medical-Surgical

## 2022-06-16 ENCOUNTER — Encounter: Payer: Self-pay | Admitting: Medical-Surgical

## 2022-06-16 VITALS — BP 104/70 | HR 96 | Resp 20 | Ht 64.0 in | Wt 148.9 lb

## 2022-06-16 DIAGNOSIS — E785 Hyperlipidemia, unspecified: Secondary | ICD-10-CM | POA: Diagnosis not present

## 2022-06-16 DIAGNOSIS — E038 Other specified hypothyroidism: Secondary | ICD-10-CM | POA: Diagnosis not present

## 2022-06-16 DIAGNOSIS — R635 Abnormal weight gain: Secondary | ICD-10-CM

## 2022-06-16 NOTE — Progress Notes (Unsigned)
Established Patient Office Visit  Subjective   Patient ID: Dana Robertson, female   DOB: 21-Jul-1980 Age: 42 y.o. MRN: [id number]   Chief Complaint  Patient presents with   Follow-up    WEIGHT GAIN   HPI Pleasant 42 year old female presenting today with continued concerns regarding weight gain.  Notes that over the last several years, she has gained approximately 40 pounds and this is very bothersome.  She has been taking efforts to do what she can to help with weight loss.  She is training 3 times a week doing weight lifting and is also making sure to walk at least 10,000 steps per day.  She has cut out most sugars and desserts although she does occasionally splurge once a week.  Has been following a high-protein diet with increased veggies and fiber intake over the last 4 months.  Has noticed some muscular definition in certain areas with these efforts however has not seen the scale move at all.  Is most concerned about the extra weight around her abdomen.  Would like to lose at least 10 to 15 pounds but nothing she does seems to make a difference.  She was started on levothyroxine 25 mcg daily to combat subclinical hypothyroidism in the setting of weight gain.  She has been taking this daily, tolerating well without side effects.  Had her labs drawn a couple of days ago which showed her thyroid function was in the normal range.   Objective:    Vitals:   06/16/22 0833  BP: 104/70  Pulse: 96  Resp: 20  Height: '5\' 4"'$  (1.626 m)  Weight: 148 lb 14.4 oz (67.5 kg)  SpO2: 98%  BMI (Calculated): 25.55    Physical Exam Vitals reviewed.  Constitutional:      General: She is not in acute distress.    Appearance: Normal appearance. She is not ill-appearing.  HENT:     Head: Normocephalic and atraumatic.  Cardiovascular:     Rate and Rhythm: Normal rate and regular rhythm.     Pulses: Normal pulses.     Heart sounds: Normal heart sounds.  Pulmonary:     Effort: Pulmonary effort is  normal. No respiratory distress.     Breath sounds: Normal breath sounds. No wheezing, rhonchi or rales.  Skin:    General: Skin is warm and dry.  Neurological:     Mental Status: She is alert and oriented to person, place, and time.  Psychiatric:        Mood and Affect: Mood normal.        Behavior: Behavior normal.        Thought Content: Thought content normal.        Judgment: Judgment normal.   No results found for this or any previous visit (from the past 24 hour(s)).   {Labs (Optional):23779}  The 10-year ASCVD risk score (Arnett DK, et al., 2019) is: 0.3%   Values used to calculate the score:     Age: 49 years     Sex: Female     Is Non-Hispanic African American: No     Diabetic: No     Tobacco smoker: No     Systolic Blood Pressure: 123456 mmHg     Is BP treated: No     HDL Cholesterol: 65 mg/dL     Total Cholesterol: 199 mg/dL   Assessment & Plan:   1. Weight gain ***  2. Subclinical hypothyroidism Continue levothyroxine 25 mcg daily.  3. Hyperlipidemia, unspecified hyperlipidemia  type Reviewed lipid results from 2 days ago.  LDL cholesterol is still slightly elevated but otherwise cholesterol looks good.  Continue to work on a low-fat heart healthy diet with regular intentional exercise.   No follow-ups on file.  ___________________________________________ Clearnce Sorrel, DNP, APRN, FNP-BC [facility] [place]

## 2022-06-17 ENCOUNTER — Encounter: Payer: Self-pay | Admitting: Medical-Surgical

## 2022-09-22 ENCOUNTER — Encounter: Payer: Self-pay | Admitting: Medical-Surgical

## 2022-09-22 DIAGNOSIS — R5383 Other fatigue: Secondary | ICD-10-CM

## 2022-09-22 DIAGNOSIS — E785 Hyperlipidemia, unspecified: Secondary | ICD-10-CM

## 2022-09-22 DIAGNOSIS — E038 Other specified hypothyroidism: Secondary | ICD-10-CM

## 2022-09-28 LAB — CBC WITH DIFFERENTIAL/PLATELET
Absolute Monocytes: 402 cells/uL (ref 200–950)
Basophils Absolute: 54 cells/uL (ref 0–200)
Basophils Relative: 0.8 %
Eosinophils Absolute: 154 cells/uL (ref 15–500)
Eosinophils Relative: 2.3 %
HCT: 38.7 % (ref 35.0–45.0)
Hemoglobin: 12.2 g/dL (ref 11.7–15.5)
Lymphs Abs: 2486 cells/uL (ref 850–3900)
MCH: 27.8 pg (ref 27.0–33.0)
MCHC: 31.5 g/dL — ABNORMAL LOW (ref 32.0–36.0)
MCV: 88.2 fL (ref 80.0–100.0)
MPV: 10.1 fL (ref 7.5–12.5)
Monocytes Relative: 6 %
Neutro Abs: 3605 cells/uL (ref 1500–7800)
Neutrophils Relative %: 53.8 %
Platelets: 407 10*3/uL — ABNORMAL HIGH (ref 140–400)
RBC: 4.39 10*6/uL (ref 3.80–5.10)
RDW: 13 % (ref 11.0–15.0)
Total Lymphocyte: 37.1 %
WBC: 6.7 10*3/uL (ref 3.8–10.8)

## 2022-09-28 LAB — LIPID PANEL
Cholesterol: 195 mg/dL (ref <200)
HDL: 82 mg/dL (ref >=50)
LDL Cholesterol (Calc): 94 mg/dL (calc)
Non-HDL Cholesterol (Calc): 113 mg/dL (calc) (ref <130)
Total CHOL/HDL Ratio: 2.4 (calc) (ref <5.0)
Triglycerides: 92 mg/dL (ref <150)

## 2022-09-28 LAB — COMPLETE METABOLIC PANEL WITH GFR
AG Ratio: 1.8 (calc) (ref 1.0–2.5)
ALT: 10 U/L (ref 6–29)
AST: 17 U/L (ref 10–30)
Albumin: 4.4 g/dL (ref 3.6–5.1)
Alkaline phosphatase (APISO): 80 U/L (ref 31–125)
BUN: 17 mg/dL (ref 7–25)
CO2: 27 mmol/L (ref 20–32)
Calcium: 9.4 mg/dL (ref 8.6–10.2)
Chloride: 104 mmol/L (ref 98–110)
Creat: 0.77 mg/dL (ref 0.50–0.99)
Globulin: 2.4 g/dL (calc) (ref 1.9–3.7)
Glucose, Bld: 93 mg/dL (ref 65–99)
Potassium: 4.6 mmol/L (ref 3.5–5.3)
Sodium: 139 mmol/L (ref 135–146)
Total Bilirubin: 0.3 mg/dL (ref 0.2–1.2)
Total Protein: 6.8 g/dL (ref 6.1–8.1)
eGFR: 99 mL/min/{1.73_m2} (ref >=60)

## 2022-09-28 LAB — TSH: TSH: 3.57 mIU/L

## 2022-09-28 LAB — IRON,TIBC AND FERRITIN PANEL
%SAT: 7 % (calc) — ABNORMAL LOW (ref 16–45)
Ferritin: 4 ng/mL — ABNORMAL LOW (ref 16–232)
Iron: 29 ug/dL — ABNORMAL LOW (ref 40–190)
TIBC: 394 mcg/dL (calc) (ref 250–450)

## 2022-09-28 LAB — VITAMIN D 25 HYDROXY (VIT D DEFICIENCY, FRACTURES): Vit D, 25-Hydroxy: 48 ng/mL (ref 30–100)

## 2022-09-30 ENCOUNTER — Other Ambulatory Visit: Payer: Self-pay

## 2022-09-30 MED ORDER — HYDROXYZINE PAMOATE 25 MG PO CAPS: 25.0000 mg | ORAL_CAPSULE | Freq: Three times a day (TID) | ORAL | 0 refills | Status: AC | PRN

## 2022-09-30 NOTE — Telephone Encounter (Signed)
Okay to refill hydroxyzine.  No appointment needed.

## 2022-11-15 IMAGING — MG MM DIGITAL SCREENING BILAT W/ TOMO AND CAD
8 series · 9 of 24 positions shown · non-contrast
Comparison: Previous exam(s).

CLINICAL DATA: Screening.

EXAM:
DIGITAL SCREENING BILATERAL MAMMOGRAM WITH TOMOSYNTHESIS AND CAD
TECHNIQUE: Bilateral screening digital craniocaudal and mediolateral oblique
mammograms were obtained. Bilateral screening digital breast
tomosynthesis was performed. The images were evaluated with
computer-aided detection.

[R MLO synth-2D]
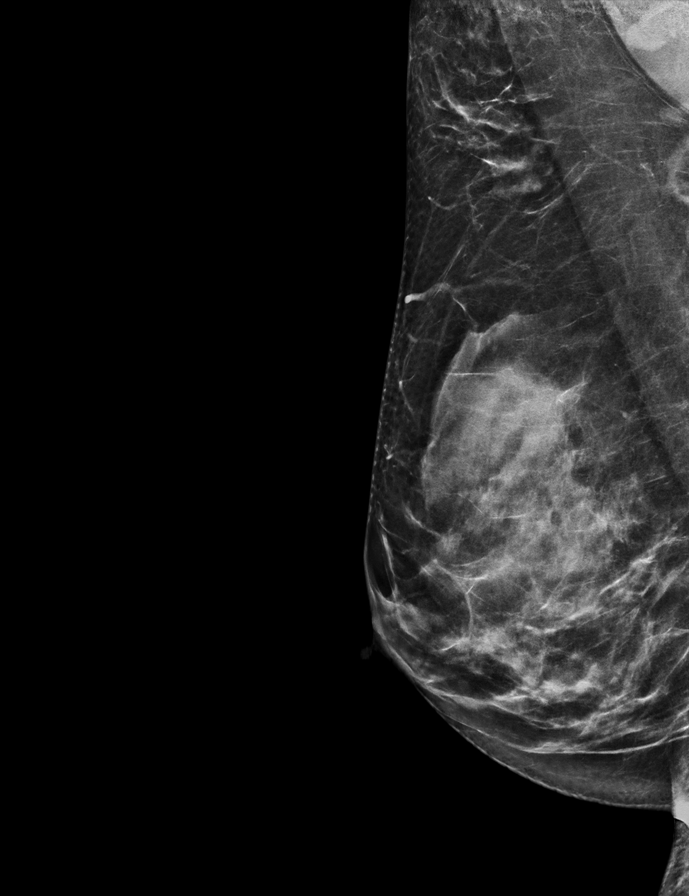

[L MLO synth-2D]
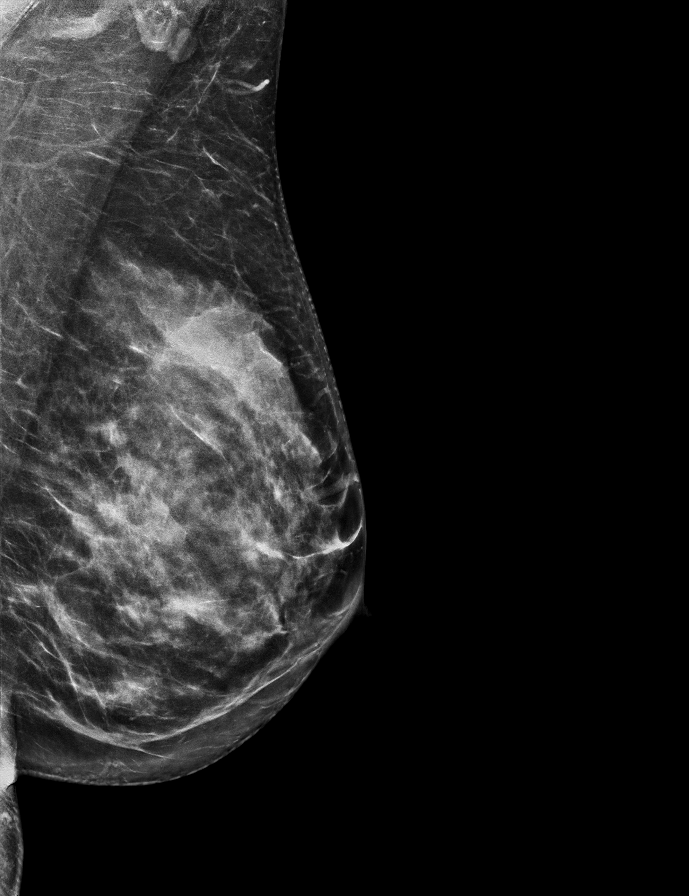

[L CC synth-2D]
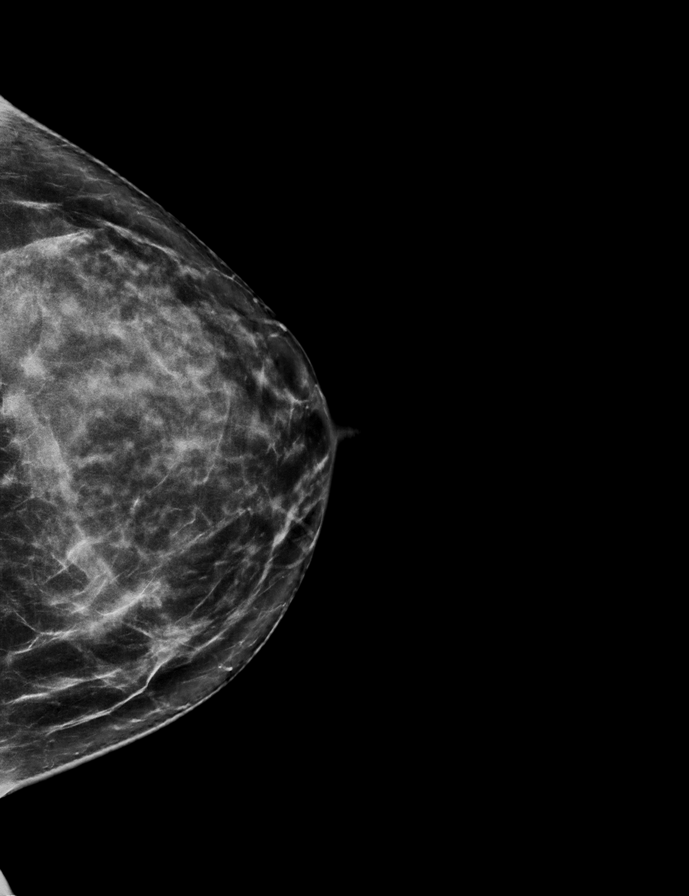

[R CC synth-2D]
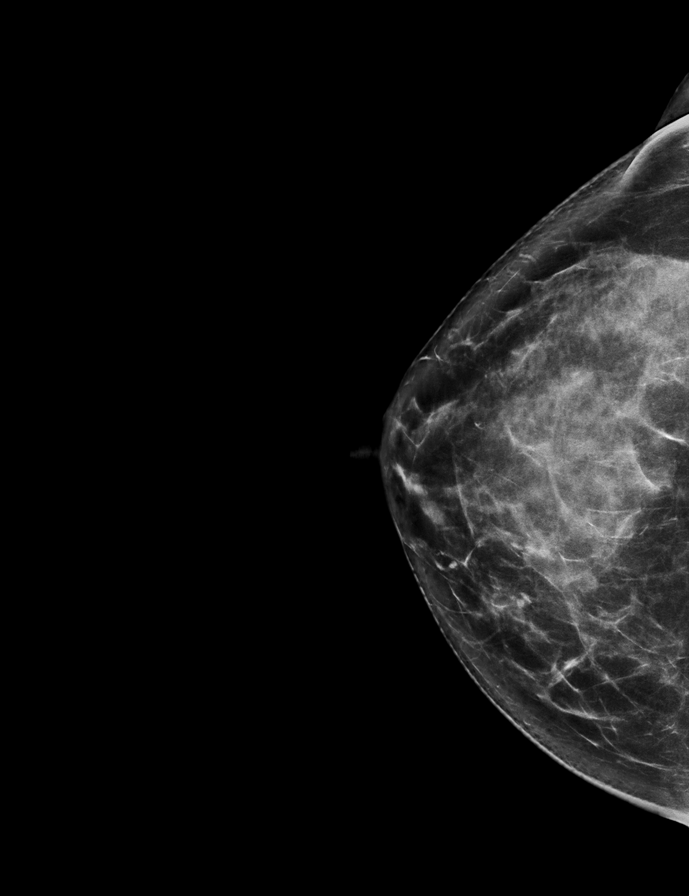

[R MLO tomo · 2 of 66 frames shown]
[frame 22/66]
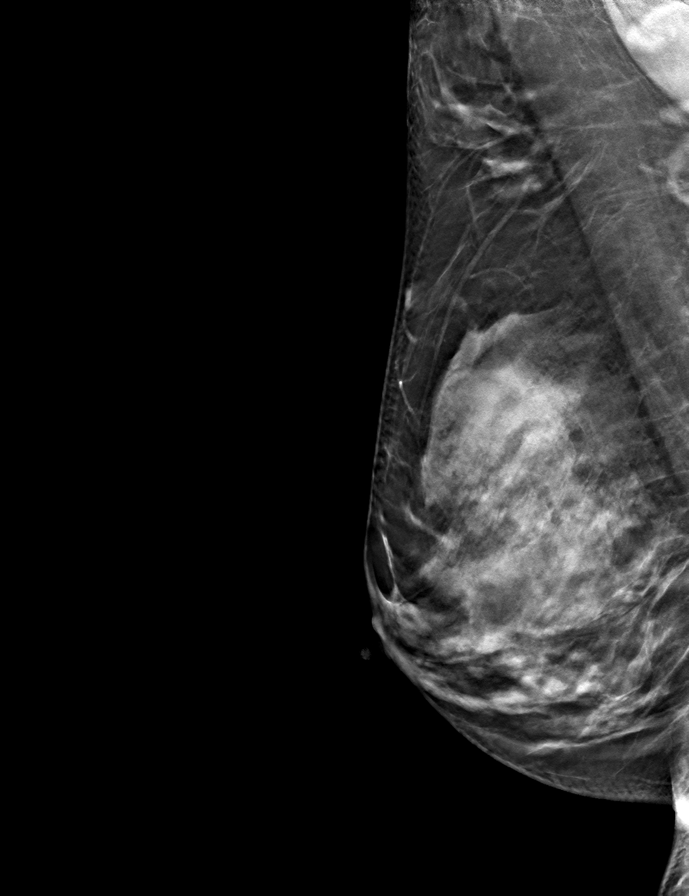
[frame 33/66]
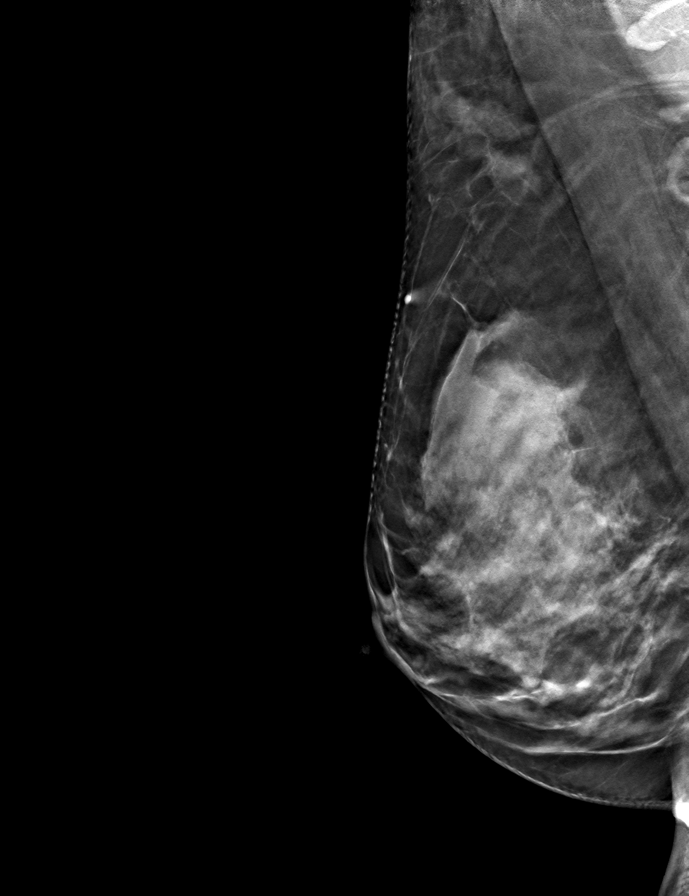

[L CC tomo · tomo slice 35/68.0]
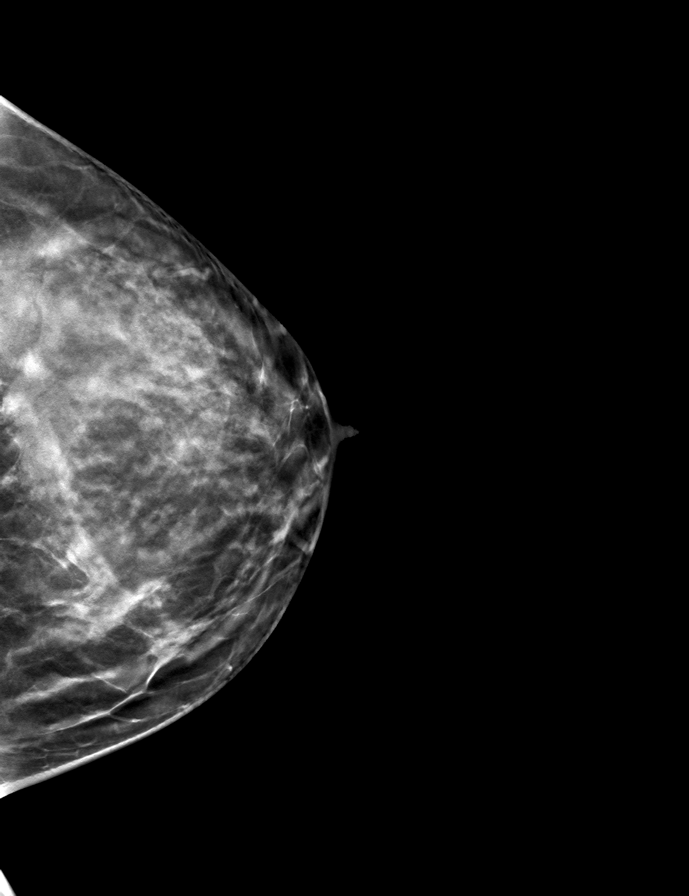

[R CC tomo · tomo slice 35/69.0]
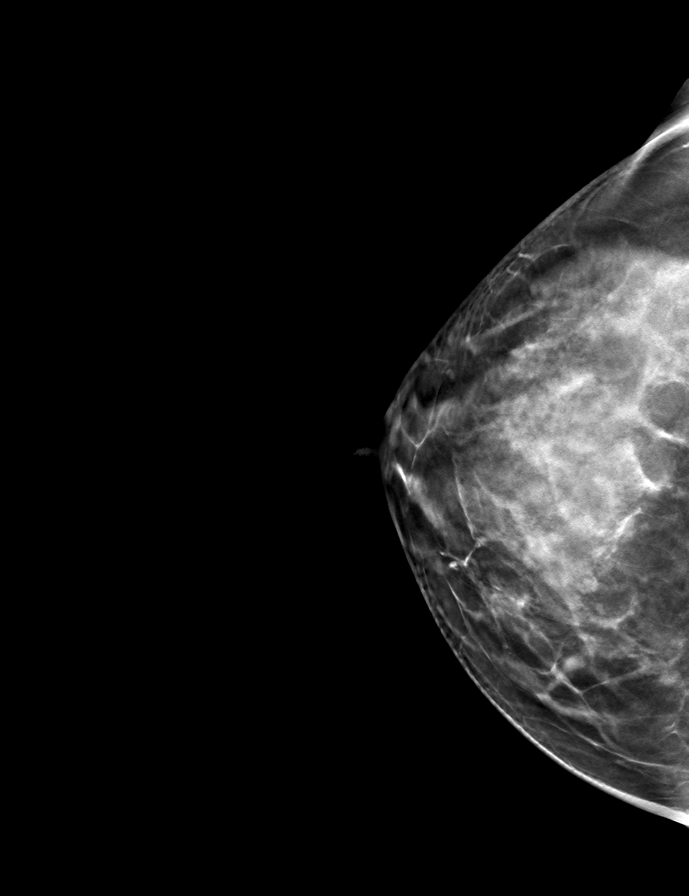

[L MLO tomo · tomo slice 33/66.0]
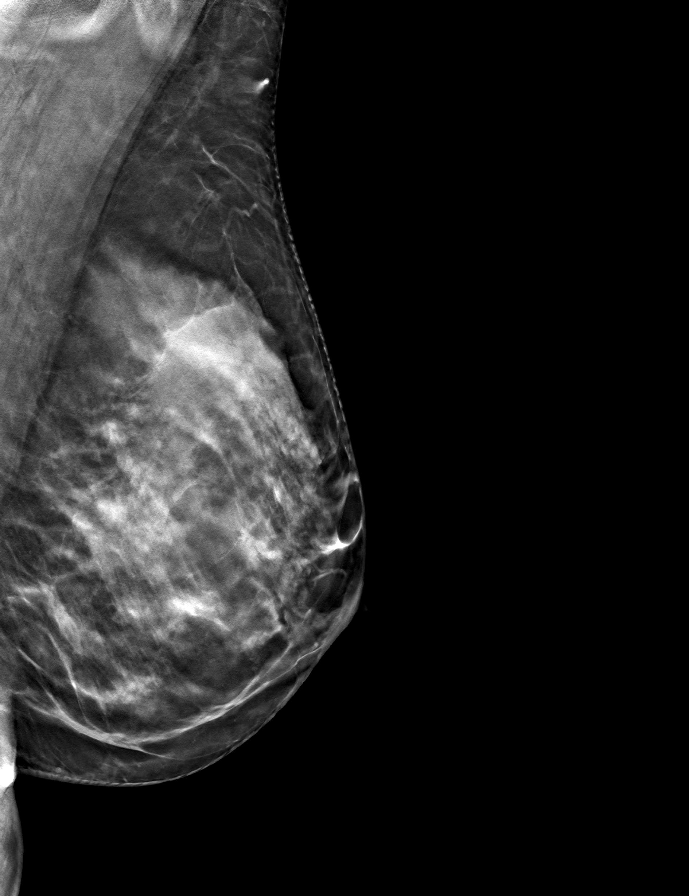

[9 of 24 positions shown; findings below may reference images not displayed]

ACR Breast Density Category c: The breast tissue is heterogeneously
dense, which may obscure small masses.
FINDINGS: There are no findings suspicious for malignancy.
IMPRESSION: No mammographic evidence of malignancy. A result letter of this
screening mammogram will be mailed directly to the patient.

RECOMMENDATION:
Screening mammogram in one year. (Code:Q3-W-BC3)

BI-RADS CATEGORY  1: Negative.

## 2023-02-09 ENCOUNTER — Other Ambulatory Visit: Payer: Self-pay | Admitting: Family Medicine

## 2023-02-09 ENCOUNTER — Ambulatory Visit: Payer: 59

## 2023-02-09 DIAGNOSIS — Z1231 Encounter for screening mammogram for malignant neoplasm of breast: Secondary | ICD-10-CM

## 2023-07-23 IMAGING — US US THYROID
1 series · 14 of 25 positions shown · non-contrast
Comparison: None Available.

CLINICAL DATA: elevated TSH/Fatigue/fullness of thyroid

EXAM:
THYROID ULTRASOUND
TECHNIQUE: Ultrasound examination of the thyroid gland and adjacent soft
tissues was performed.

[Series 1: us thyroid · 14 of 39 slices shown]
[im 1/39]
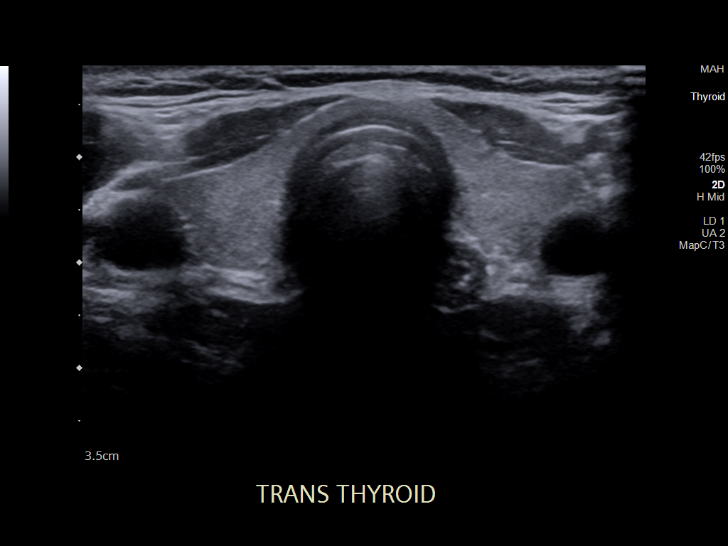
[im 4/39]
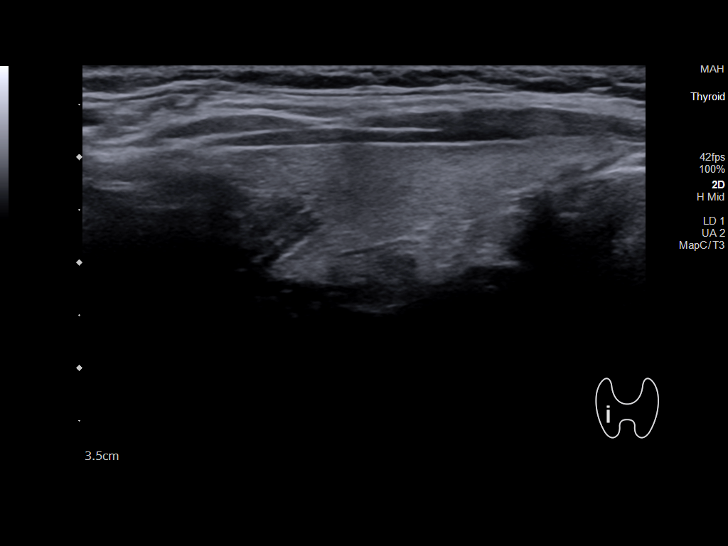
[im 7/39]
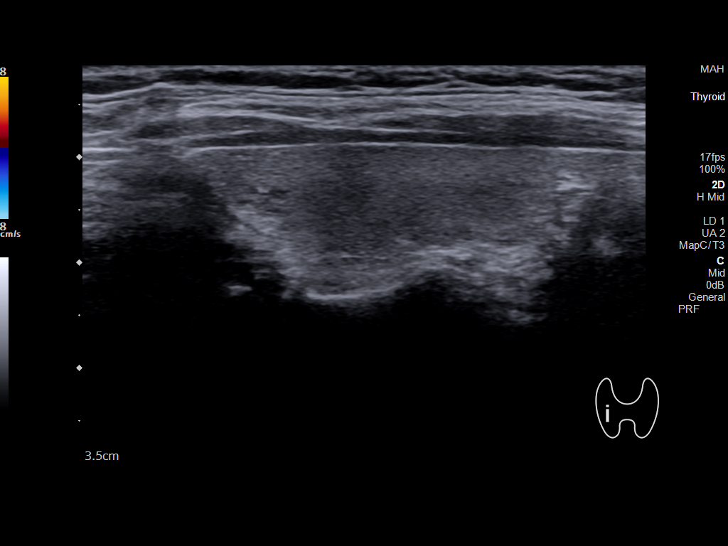
[im 10/39]
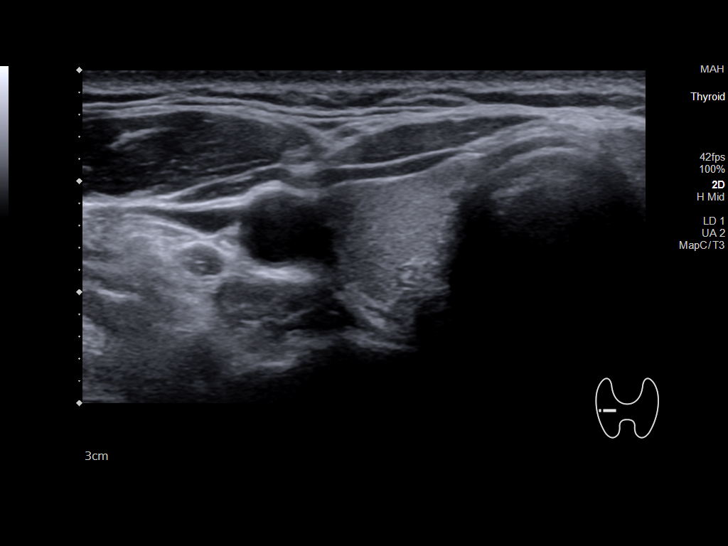
[im 13/39]
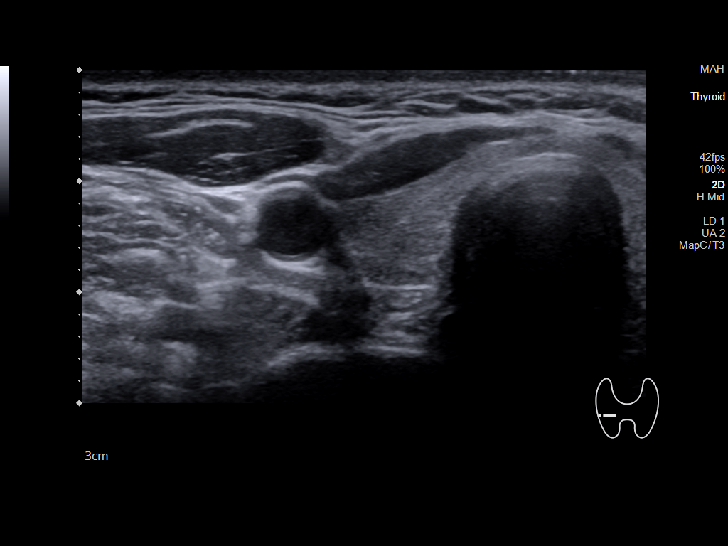
[im 15/39]
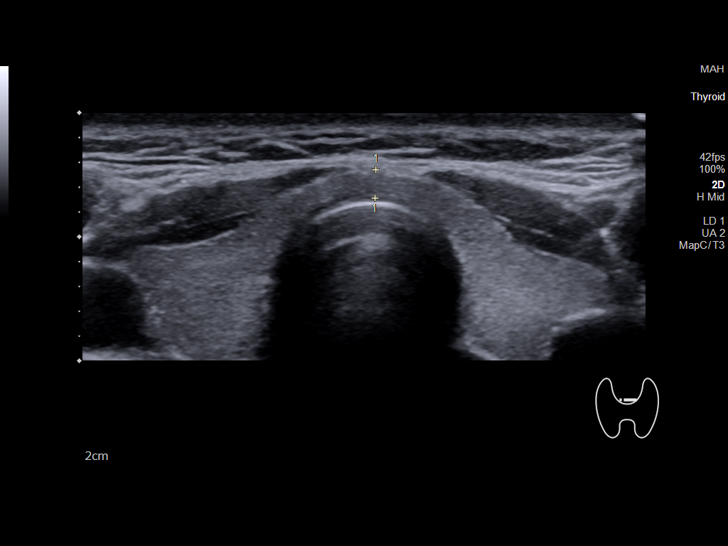
[im 18/39]
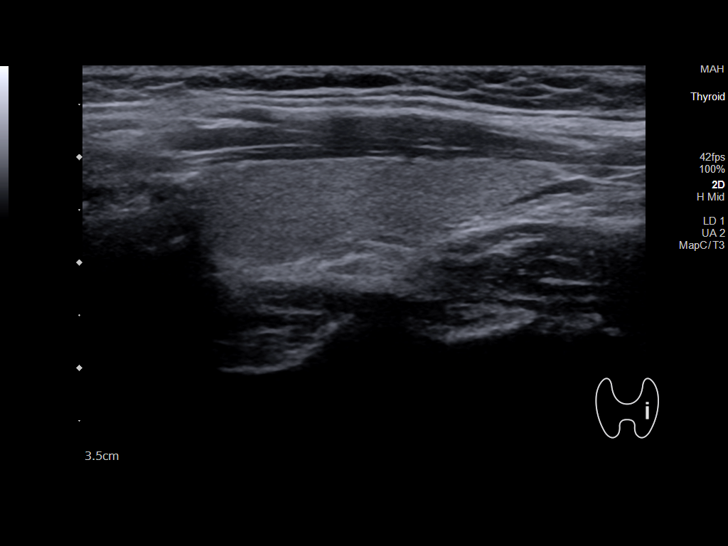
[im 21/39]
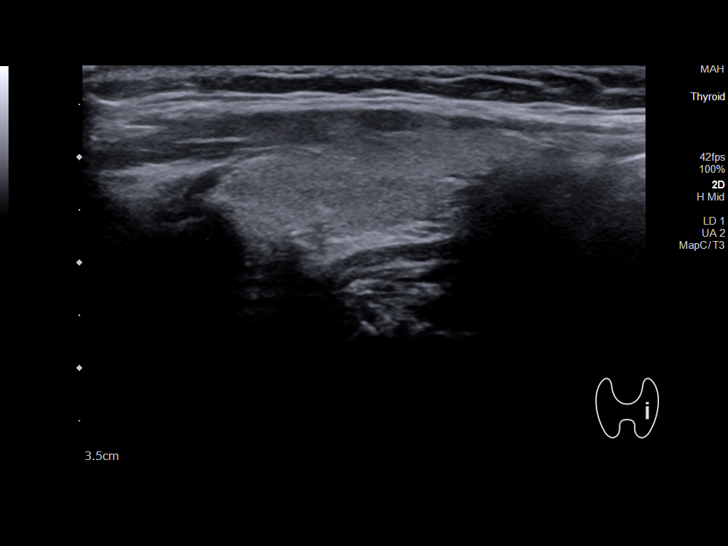
[im 24/39]
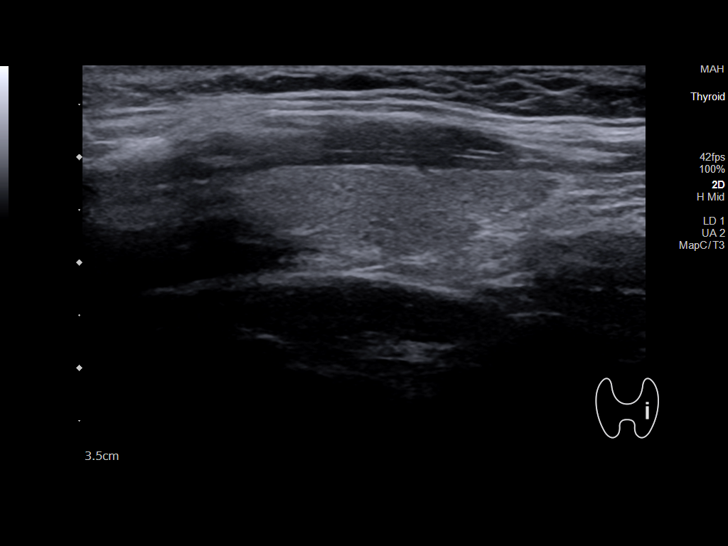
[im 26/39]
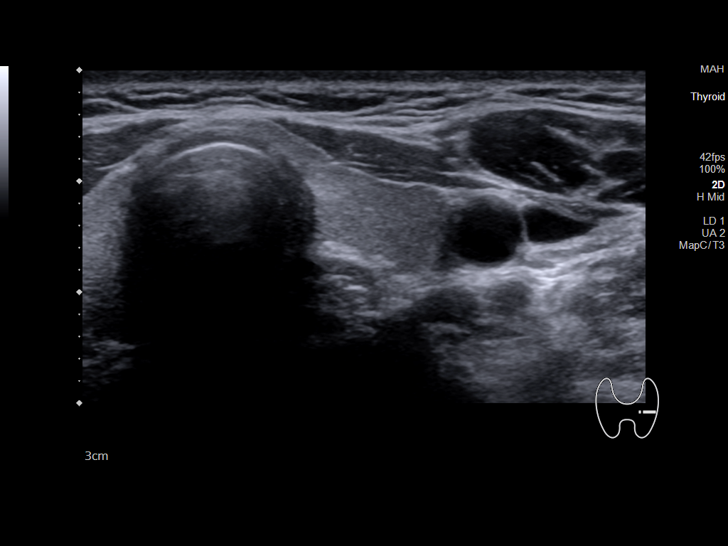
[im 29/39]
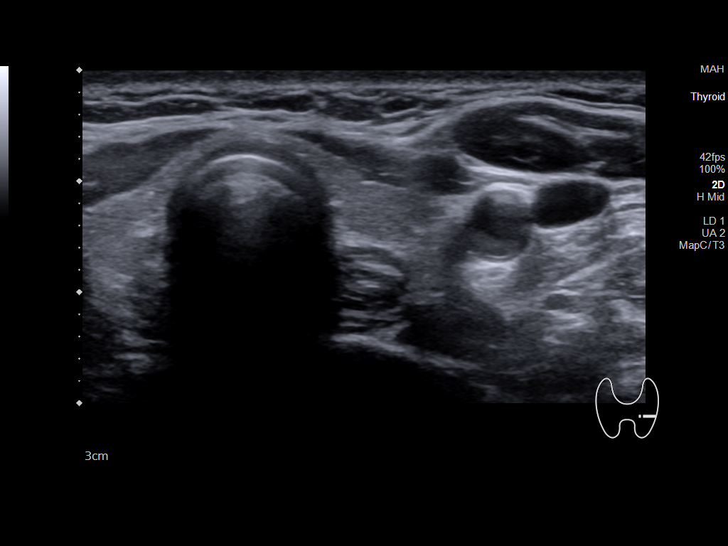
[im 32/39]
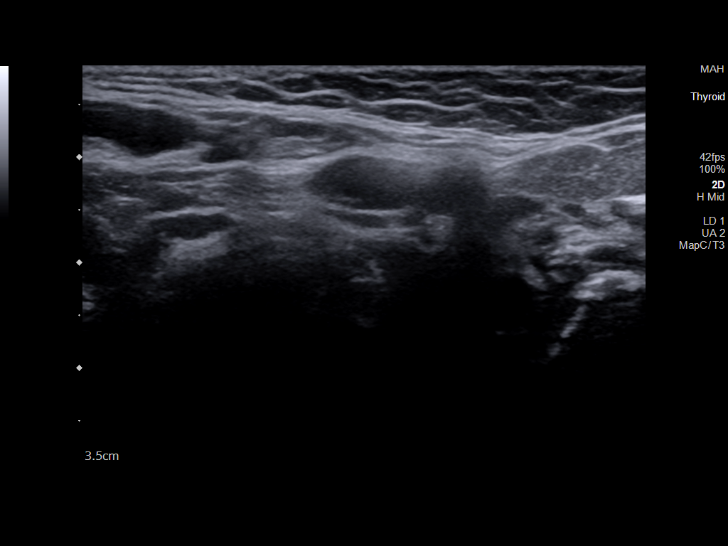
[im 35/39]
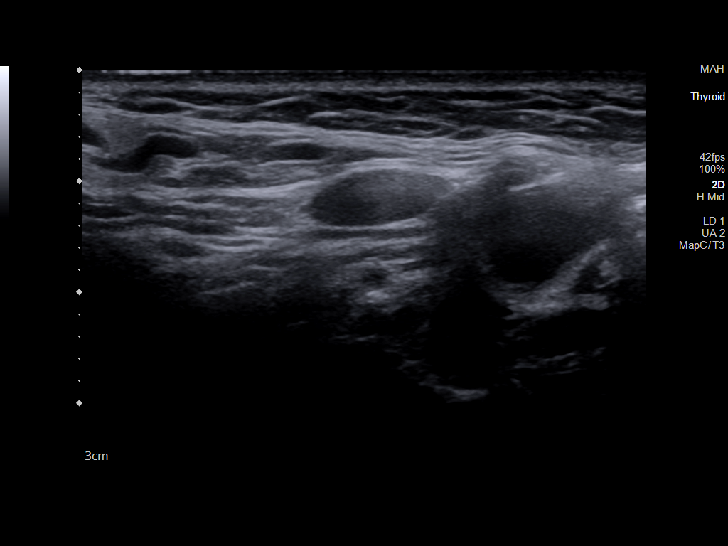
[im 39/39]
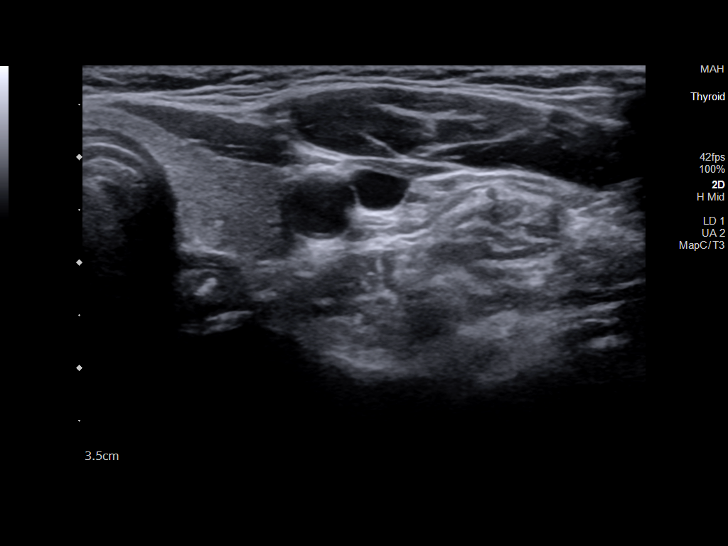

[14 of 25 positions shown; findings below may reference images not displayed]

FINDINGS: Parenchymal Echotexture: Normal

Isthmus: 0.2 cm

Right lobe: 3.8 x 1.1 x 1.3 cm

Left lobe: 3.6 x 1.0 x 1.2 cm

_________________________________________________________

Estimated total number of nodules >/= 1 cm: 0

Number of spongiform nodules >/=  2 cm not described below (TR1): 0

Number of mixed cystic and solid nodules >/= 1.5 cm not described
below (TR2): 0

_________________________________________________________

No discrete nodules are seen within the thyroid gland.

Nonenlarged lymph node noted in the right submandibular region.
IMPRESSION: Normal thyroid ultrasound

The above is in keeping with the ACR TI-RADS recommendations - [HOSPITAL] 2874;[DATE].

## 2024-03-15 ENCOUNTER — Other Ambulatory Visit: Payer: Self-pay | Admitting: Family Medicine

## 2024-03-15 DIAGNOSIS — Z1231 Encounter for screening mammogram for malignant neoplasm of breast: Secondary | ICD-10-CM

## 2024-03-22 ENCOUNTER — Encounter

## 2024-03-22 DIAGNOSIS — Z1231 Encounter for screening mammogram for malignant neoplasm of breast: Secondary | ICD-10-CM | POA: Diagnosis not present
